# Patient Record
Sex: Male | Born: 1950 | Race: White | Hispanic: No | Marital: Married | Smoking: Current every day smoker
Health system: Southern US, Community
[De-identification: ages and names within clinical notes are randomized; demographics above are authoritative.]

## PROBLEM LIST (undated history)

## (undated) DIAGNOSIS — E785 Hyperlipidemia, unspecified: Secondary | ICD-10-CM

## (undated) DIAGNOSIS — M1990 Unspecified osteoarthritis, unspecified site: Secondary | ICD-10-CM

## (undated) DIAGNOSIS — I10 Essential (primary) hypertension: Secondary | ICD-10-CM

## (undated) DIAGNOSIS — K219 Gastro-esophageal reflux disease without esophagitis: Secondary | ICD-10-CM

## (undated) DIAGNOSIS — Z972 Presence of dental prosthetic device (complete) (partial): Secondary | ICD-10-CM

## (undated) DIAGNOSIS — G4730 Sleep apnea, unspecified: Secondary | ICD-10-CM

## (undated) DIAGNOSIS — Z87442 Personal history of urinary calculi: Secondary | ICD-10-CM

## (undated) DIAGNOSIS — R42 Dizziness and giddiness: Secondary | ICD-10-CM

## (undated) DIAGNOSIS — M109 Gout, unspecified: Secondary | ICD-10-CM

## (undated) DIAGNOSIS — R011 Cardiac murmur, unspecified: Secondary | ICD-10-CM

## (undated) DIAGNOSIS — G510 Bell's palsy: Secondary | ICD-10-CM

## (undated) DIAGNOSIS — J449 Chronic obstructive pulmonary disease, unspecified: Secondary | ICD-10-CM

## (undated) DIAGNOSIS — I480 Paroxysmal atrial fibrillation: Secondary | ICD-10-CM

## (undated) HISTORY — PX: COLONOSCOPY WITH PROPOFOL: SHX5780

## (undated) HISTORY — DX: Gout, unspecified: M10.9

## (undated) HISTORY — PX: LASIK: SHX215

## (undated) HISTORY — DX: Unspecified osteoarthritis, unspecified site: M19.90

## (undated) HISTORY — PX: KIDNEY STONE SURGERY: SHX686

## (undated) HISTORY — DX: Essential (primary) hypertension: I10

## (undated) HISTORY — DX: Cardiac murmur, unspecified: R01.1

## (undated) HISTORY — DX: Hyperlipidemia, unspecified: E78.5

## (undated) HISTORY — DX: Gastro-esophageal reflux disease without esophagitis: K21.9

## (undated) HISTORY — DX: Sleep apnea, unspecified: G47.30

## (undated) HISTORY — PX: SHOULDER ARTHROSCOPY: SHX128

---

## 2006-12-25 ENCOUNTER — Emergency Department: Payer: Self-pay | Admitting: Emergency Medicine

## 2008-02-21 ENCOUNTER — Ambulatory Visit: Payer: Self-pay | Admitting: Unknown Physician Specialty

## 2014-05-30 ENCOUNTER — Ambulatory Visit: Payer: Self-pay | Admitting: Neurology

## 2014-06-10 DIAGNOSIS — I480 Paroxysmal atrial fibrillation: Secondary | ICD-10-CM | POA: Insufficient documentation

## 2014-06-10 DIAGNOSIS — G4733 Obstructive sleep apnea (adult) (pediatric): Secondary | ICD-10-CM | POA: Insufficient documentation

## 2014-07-08 ENCOUNTER — Ambulatory Visit: Admit: 2014-07-08 | Disposition: A | Discharge status: Home / Self Care | Payer: Self-pay | Admitting: Neurology

## 2015-04-13 DIAGNOSIS — I10 Essential (primary) hypertension: Secondary | ICD-10-CM | POA: Insufficient documentation

## 2015-09-14 ENCOUNTER — Ambulatory Visit
Admission: RE | Admit: 2015-09-14 | Discharge: 2015-09-14 | Disposition: A | Discharge status: Home / Self Care | Payer: Medicare Other | Source: Ambulatory Visit | Attending: Neurology | Admitting: Neurology

## 2015-09-14 ENCOUNTER — Other Ambulatory Visit: Payer: Self-pay | Admitting: Neurology

## 2015-09-14 DIAGNOSIS — R29810 Facial weakness: Secondary | ICD-10-CM

## 2015-09-14 DIAGNOSIS — R930 Abnormal findings on diagnostic imaging of skull and head, not elsewhere classified: Secondary | ICD-10-CM | POA: Insufficient documentation

## 2015-09-14 DIAGNOSIS — R2990 Unspecified symptoms and signs involving the nervous system: Secondary | ICD-10-CM

## 2015-09-14 LAB — POCT I-STAT CREATININE: CREATININE: 1.7 mg/dL — AB (ref 0.61–1.24)

## 2015-09-14 MED ORDER — GADOBENATE DIMEGLUMINE 529 MG/ML IV SOLN
20.0000 mL | Freq: Once | INTRAVENOUS | Status: AC | PRN
  Administered 2015-09-14: 10 mL via INTRAVENOUS

## 2016-11-11 ENCOUNTER — Emergency Department: Payer: Medicare Other

## 2016-11-11 ENCOUNTER — Encounter: Payer: Self-pay | Admitting: Emergency Medicine

## 2016-11-11 ENCOUNTER — Inpatient Hospital Stay
Admission: EM | Admit: 2016-11-11 | Discharge: 2016-11-14 | DRG: 190 | Disposition: A | Discharge status: Home / Self Care | Payer: Medicare Other | Attending: Internal Medicine | Admitting: Internal Medicine

## 2016-11-11 DIAGNOSIS — E1122 Type 2 diabetes mellitus with diabetic chronic kidney disease: Secondary | ICD-10-CM | POA: Diagnosis present

## 2016-11-11 DIAGNOSIS — I480 Paroxysmal atrial fibrillation: Secondary | ICD-10-CM | POA: Diagnosis present

## 2016-11-11 DIAGNOSIS — J441 Chronic obstructive pulmonary disease with (acute) exacerbation: Secondary | ICD-10-CM

## 2016-11-11 DIAGNOSIS — G4733 Obstructive sleep apnea (adult) (pediatric): Secondary | ICD-10-CM | POA: Diagnosis present

## 2016-11-11 DIAGNOSIS — I959 Hypotension, unspecified: Secondary | ICD-10-CM | POA: Diagnosis present

## 2016-11-11 DIAGNOSIS — J9601 Acute respiratory failure with hypoxia: Secondary | ICD-10-CM | POA: Diagnosis present

## 2016-11-11 DIAGNOSIS — I129 Hypertensive chronic kidney disease with stage 1 through stage 4 chronic kidney disease, or unspecified chronic kidney disease: Secondary | ICD-10-CM | POA: Diagnosis present

## 2016-11-11 DIAGNOSIS — I248 Other forms of acute ischemic heart disease: Secondary | ICD-10-CM | POA: Diagnosis present

## 2016-11-11 DIAGNOSIS — E785 Hyperlipidemia, unspecified: Secondary | ICD-10-CM | POA: Diagnosis present

## 2016-11-11 DIAGNOSIS — E876 Hypokalemia: Secondary | ICD-10-CM | POA: Diagnosis not present

## 2016-11-11 DIAGNOSIS — R0600 Dyspnea, unspecified: Secondary | ICD-10-CM

## 2016-11-11 DIAGNOSIS — F17210 Nicotine dependence, cigarettes, uncomplicated: Secondary | ICD-10-CM | POA: Diagnosis present

## 2016-11-11 DIAGNOSIS — N183 Chronic kidney disease, stage 3 (moderate): Secondary | ICD-10-CM | POA: Diagnosis present

## 2016-11-11 DIAGNOSIS — Z7982 Long term (current) use of aspirin: Secondary | ICD-10-CM | POA: Diagnosis not present

## 2016-11-11 DIAGNOSIS — E872 Acidosis: Secondary | ICD-10-CM | POA: Diagnosis present

## 2016-11-11 DIAGNOSIS — E860 Dehydration: Secondary | ICD-10-CM | POA: Diagnosis present

## 2016-11-11 DIAGNOSIS — J9600 Acute respiratory failure, unspecified whether with hypoxia or hypercapnia: Secondary | ICD-10-CM | POA: Diagnosis present

## 2016-11-11 DIAGNOSIS — N179 Acute kidney failure, unspecified: Secondary | ICD-10-CM | POA: Diagnosis present

## 2016-11-11 DIAGNOSIS — J189 Pneumonia, unspecified organism: Secondary | ICD-10-CM

## 2016-11-11 DIAGNOSIS — Z7901 Long term (current) use of anticoagulants: Secondary | ICD-10-CM | POA: Diagnosis not present

## 2016-11-11 DIAGNOSIS — Z87442 Personal history of urinary calculi: Secondary | ICD-10-CM | POA: Diagnosis not present

## 2016-11-11 HISTORY — DX: Chronic obstructive pulmonary disease, unspecified: J44.9

## 2016-11-11 LAB — BASIC METABOLIC PANEL
Anion gap: 12 (ref 5–15)
BUN: 33 mg/dL — AB (ref 6–20)
CHLORIDE: 99 mmol/L — AB (ref 101–111)
CO2: 26 mmol/L (ref 22–32)
CREATININE: 2.64 mg/dL — AB (ref 0.61–1.24)
Calcium: 9.6 mg/dL (ref 8.9–10.3)
GFR calc Af Amer: 27 mL/min — ABNORMAL LOW (ref >60)
GFR calc non Af Amer: 24 mL/min — ABNORMAL LOW (ref >60)
GLUCOSE: 154 mg/dL — AB (ref 65–99)
POTASSIUM: 3.5 mmol/L (ref 3.5–5.1)
Sodium: 137 mmol/L (ref 135–145)

## 2016-11-11 LAB — CBC
HEMATOCRIT: 50.2 % (ref 40.0–52.0)
Hemoglobin: 16.9 g/dL (ref 13.0–18.0)
MCH: 31.5 pg (ref 26.0–34.0)
MCHC: 33.6 g/dL (ref 32.0–36.0)
MCV: 93.7 fL (ref 80.0–100.0)
Platelets: 254 10*3/uL (ref 150–440)
RBC: 5.36 MIL/uL (ref 4.40–5.90)
RDW: 14.5 % (ref 11.5–14.5)
WBC: 30.4 10*3/uL — AB (ref 3.8–10.6)

## 2016-11-11 LAB — GLUCOSE, CAPILLARY
Glucose-Capillary: 202 mg/dL — ABNORMAL HIGH (ref 65–99)
Glucose-Capillary: 209 mg/dL — ABNORMAL HIGH (ref 65–99)

## 2016-11-11 LAB — LACTIC ACID, PLASMA
LACTIC ACID, VENOUS: 1.9 mmol/L (ref 0.5–1.9)
Lactic Acid, Venous: 2.1 mmol/L (ref 0.5–1.9)

## 2016-11-11 LAB — TROPONIN I
TROPONIN I: 0.12 ng/mL — AB (ref <0.03)
Troponin I: 0.04 ng/mL (ref <0.03)

## 2016-11-11 LAB — MRSA PCR SCREENING: MRSA BY PCR: NEGATIVE

## 2016-11-11 MED ORDER — ACETAMINOPHEN 325 MG PO TABS
650.0000 mg | ORAL_TABLET | Freq: Four times a day (QID) | ORAL | Status: DC | PRN
Start: 2016-11-11 — End: 2016-11-14

## 2016-11-11 MED ORDER — SODIUM CHLORIDE 0.9 % IV SOLN: INTRAVENOUS | Status: DC

## 2016-11-11 MED ORDER — METHYLPREDNISOLONE SODIUM SUCC 125 MG IJ SOLR: 60.0000 mg | INTRAMUSCULAR | Status: DC

## 2016-11-11 MED ORDER — SODIUM CHLORIDE 0.9 % IV BOLUS (SEPSIS)
1000.0000 mL | Freq: Once | INTRAVENOUS | Status: AC
Start: 2016-11-11 — End: 2016-11-11
  Administered 2016-11-11: 1000 mL via INTRAVENOUS

## 2016-11-11 MED ORDER — ALBUTEROL SULFATE (2.5 MG/3ML) 0.083% IN NEBU
5.0000 mg | INHALATION_SOLUTION | Freq: Once | RESPIRATORY_TRACT | Status: AC
  Administered 2016-11-11: 5 mg via RESPIRATORY_TRACT
  Filled 2016-11-11: qty 6

## 2016-11-11 MED ORDER — ASPIRIN EC 81 MG PO TBEC
324.0000 mg | DELAYED_RELEASE_TABLET | Freq: Every day | ORAL | Status: DC
  Administered 2016-11-12 – 2016-11-13 (×2): 324 mg via ORAL
  Filled 2016-11-11 (×4): qty 4

## 2016-11-11 MED ORDER — BUDESONIDE 0.25 MG/2ML IN SUSP
0.2500 mg | Freq: Two times a day (BID) | RESPIRATORY_TRACT | Status: DC
  Administered 2016-11-11 – 2016-11-14 (×6): 0.25 mg via RESPIRATORY_TRACT
  Filled 2016-11-11 (×6): qty 2

## 2016-11-11 MED ORDER — CHLORTHALIDONE 25 MG PO TABS
25.0000 mg | ORAL_TABLET | Freq: Every day | ORAL | Status: DC
  Administered 2016-11-13 – 2016-11-14 (×2): 25 mg via ORAL
  Filled 2016-11-11 (×3): qty 1

## 2016-11-11 MED ORDER — IPRATROPIUM-ALBUTEROL 0.5-2.5 (3) MG/3ML IN SOLN
3.0000 mL | Freq: Once | RESPIRATORY_TRACT | Status: AC
  Administered 2016-11-11: 3 mL via RESPIRATORY_TRACT
  Filled 2016-11-11: qty 3

## 2016-11-11 MED ORDER — MAGNESIUM SULFATE 2 GM/50ML IV SOLN
2.0000 g | Freq: Once | INTRAVENOUS | Status: AC
  Administered 2016-11-11: 2 g via INTRAVENOUS
  Filled 2016-11-11: qty 50

## 2016-11-11 MED ORDER — IPRATROPIUM-ALBUTEROL 0.5-2.5 (3) MG/3ML IN SOLN
3.0000 mL | RESPIRATORY_TRACT | Status: DC
  Administered 2016-11-11 – 2016-11-13 (×8): 3 mL via RESPIRATORY_TRACT
  Filled 2016-11-11 (×10): qty 3

## 2016-11-11 MED ORDER — ATORVASTATIN CALCIUM 20 MG PO TABS
40.0000 mg | ORAL_TABLET | Freq: Every day | ORAL | Status: DC
  Administered 2016-11-12 – 2016-11-14 (×3): 40 mg via ORAL
  Filled 2016-11-11 (×3): qty 2

## 2016-11-11 MED ORDER — IPRATROPIUM-ALBUTEROL 0.5-2.5 (3) MG/3ML IN SOLN: 3.0000 mL | Freq: Four times a day (QID) | RESPIRATORY_TRACT | Status: DC

## 2016-11-11 MED ORDER — METHYLPREDNISOLONE SODIUM SUCC 40 MG IJ SOLR
40.0000 mg | Freq: Two times a day (BID) | INTRAMUSCULAR | Status: DC
  Administered 2016-11-12 – 2016-11-14 (×5): 40 mg via INTRAVENOUS
  Filled 2016-11-11 (×5): qty 1

## 2016-11-11 MED ORDER — DEXTROSE 5 % IV SOLN
500.0000 mg | INTRAVENOUS | Status: DC
  Administered 2016-11-12 – 2016-11-13 (×2): 500 mg via INTRAVENOUS
  Filled 2016-11-11 (×3): qty 500

## 2016-11-11 MED ORDER — DEXTROSE 5 % IV SOLN
1.0000 g | INTRAVENOUS | Status: DC
  Filled 2016-11-11: qty 10

## 2016-11-11 MED ORDER — SODIUM CHLORIDE 0.9 % IV SOLN
INTRAVENOUS | Status: DC
  Administered 2016-11-11: 21:00:00 via INTRAVENOUS
  Administered 2016-11-12: 100 mL/h via INTRAVENOUS
  Administered 2016-11-12: 11:00:00 via INTRAVENOUS
  Administered 2016-11-12: 100 mL via INTRAVENOUS
  Administered 2016-11-14: 05:00:00 via INTRAVENOUS

## 2016-11-11 MED ORDER — ALLOPURINOL 300 MG PO TABS
300.0000 mg | ORAL_TABLET | Freq: Every day | ORAL | Status: DC
  Administered 2016-11-12 – 2016-11-14 (×3): 300 mg via ORAL
  Filled 2016-11-11 (×3): qty 1

## 2016-11-11 MED ORDER — METHYLPREDNISOLONE SODIUM SUCC 125 MG IJ SOLR
125.0000 mg | Freq: Once | INTRAMUSCULAR | Status: AC
  Administered 2016-11-11: 125 mg via INTRAVENOUS
  Filled 2016-11-11: qty 2

## 2016-11-11 MED ORDER — VERAPAMIL HCL ER 180 MG PO TBCR
360.0000 mg | EXTENDED_RELEASE_TABLET | Freq: Every day | ORAL | Status: DC
  Administered 2016-11-12 – 2016-11-14 (×3): 360 mg via ORAL
  Filled 2016-11-11 (×4): qty 2

## 2016-11-11 MED ORDER — ACETAMINOPHEN 650 MG RE SUPP: 650.0000 mg | Freq: Four times a day (QID) | RECTAL | Status: DC | PRN

## 2016-11-11 MED ORDER — DEXTROSE 5 % IV SOLN
1.0000 g | Freq: Once | INTRAVENOUS | Status: AC
  Administered 2016-11-11: 1 g via INTRAVENOUS
  Filled 2016-11-11: qty 10

## 2016-11-11 MED ORDER — DEXTROSE 5 % IV SOLN
500.0000 mg | Freq: Once | INTRAVENOUS | Status: AC
  Administered 2016-11-11: 500 mg via INTRAVENOUS
  Filled 2016-11-11: qty 500

## 2016-11-11 MED ORDER — SODIUM CHLORIDE 0.9 % IV BOLUS (SEPSIS)
1000.0000 mL | Freq: Once | INTRAVENOUS | Status: AC
  Administered 2016-11-11: 1000 mL via INTRAVENOUS

## 2016-11-11 MED ORDER — ONDANSETRON HCL 4 MG PO TABS: 4.0000 mg | ORAL_TABLET | Freq: Four times a day (QID) | ORAL | Status: DC | PRN

## 2016-11-11 MED ORDER — BISACODYL 5 MG PO TBEC: 5.0000 mg | DELAYED_RELEASE_TABLET | Freq: Every day | ORAL | Status: DC | PRN

## 2016-11-11 MED ORDER — IPRATROPIUM-ALBUTEROL 0.5-2.5 (3) MG/3ML IN SOLN: 3.0000 mL | Freq: Four times a day (QID) | RESPIRATORY_TRACT | Status: DC | PRN

## 2016-11-11 MED ORDER — ONDANSETRON HCL 4 MG/2ML IJ SOLN: 4.0000 mg | Freq: Four times a day (QID) | INTRAMUSCULAR | Status: DC | PRN

## 2016-11-11 MED ORDER — APIXABAN 5 MG PO TABS
5.0000 mg | ORAL_TABLET | Freq: Two times a day (BID) | ORAL | Status: DC
  Administered 2016-11-11 – 2016-11-14 (×6): 5 mg via ORAL
  Filled 2016-11-11 (×6): qty 1

## 2016-11-11 MED ORDER — INSULIN ASPART 100 UNIT/ML ~~LOC~~ SOLN
0.0000 [IU] | SUBCUTANEOUS | Status: DC
  Administered 2016-11-11: 2 [IU] via SUBCUTANEOUS
  Administered 2016-11-12: 1 [IU] via SUBCUTANEOUS
  Administered 2016-11-12: 2 [IU] via SUBCUTANEOUS
  Administered 2016-11-12: 3 [IU] via SUBCUTANEOUS
  Administered 2016-11-12: 5 [IU] via SUBCUTANEOUS
  Administered 2016-11-12: 100 [IU] via SUBCUTANEOUS
  Administered 2016-11-13: 1 [IU] via SUBCUTANEOUS
  Administered 2016-11-13 (×2): 2 [IU] via SUBCUTANEOUS
  Administered 2016-11-13: 1 [IU] via SUBCUTANEOUS
  Administered 2016-11-13: 2 [IU] via SUBCUTANEOUS
  Filled 2016-11-11 (×11): qty 1

## 2016-11-11 MED ORDER — DOCUSATE SODIUM 100 MG PO CAPS
100.0000 mg | ORAL_CAPSULE | Freq: Two times a day (BID) | ORAL | Status: DC
  Administered 2016-11-11 – 2016-11-14 (×6): 100 mg via ORAL
  Filled 2016-11-11 (×6): qty 1

## 2016-11-11 NOTE — ED Notes (Signed)
ED Provider at bedside. 

## 2016-11-11 NOTE — ED Provider Notes (Signed)
[facility] Emergency Department Provider Note    First MD Initiated Contact with Patient 11/11/16 1627     (approximate)  I have reviewed the triage vital signs and the nursing notes.   HISTORY  Chief Complaint Shortness of Breath    HPI Christian Cline is a 66 y.o. male with a history of COPD not on any home oxygen presents with 3 days of worsening shortness of breath, fatigue, fever and chills with a productive cough with dark phlegm. Denies any chest pain. States that symptoms have become progressively worse. Went to an urgent care this morning due to worsening generalized malaise and was directed to the ER. Has not been on any recent antibiotics. No abdominal pain. No lower extremity swelling. No history of congestive heart failure.   Past Medical History:  Diagnosis Date  . COPD (chronic obstructive pulmonary disease) ([place])    No family history on file. No past surgical history on file. Patient Active Problem List   Diagnosis Date Noted  . Acute respiratory failure ([place]) 11/11/2016      Prior to Admission medications   Medication Sig Start Date End Date Taking? Authorizing Provider  allopurinol (ZYLOPRIM) 300 MG tablet Take 1 tablet by mouth daily. 09/30/16  Yes [provider]  aspirin EC 81 MG tablet Take 4 tablets by mouth daily. 09/16/15  Yes [provider]  atorvastatin (LIPITOR) 40 MG tablet Take 1 tablet by mouth daily.   Yes [provider]  chlorthalidone (HYGROTON) 25 MG tablet Take 1 tablet by mouth daily. 08/19/16  Yes [provider]  docusate sodium (COLACE) 100 MG capsule Take 100 mg by mouth daily as needed for mild constipation.   Yes [provider]  ELIQUIS 5 MG TABS tablet Take 1 tablet by mouth 2 (two) times daily. 09/04/16  Yes [provider]  meclizine (ANTIVERT) 25 MG tablet Take 1 tablet by mouth 3 (three) times daily as needed.   Yes [provider]    quinapril (ACCUPRIL) 40 MG tablet Take 1 tablet by mouth daily. 09/30/16  Yes [provider]  ranitidine (ZANTAC) 150 MG tablet Take 150 mg by mouth 2 (two) times daily.   Yes [provider]  verapamil (VERELAN PM) 360 MG 24 hr capsule Take 1 capsule by mouth daily. 08/10/16  Yes [provider]  Ipratropium-Albuterol (COMBIVENT RESPIMAT) 20-100 MCG/ACT AERS respimat Inhale 1 puff into the lungs every 6 (six) hours.    [provider]    Allergies Patient has no known allergies.    Social History Social History  Substance Use Topics  . Smoking status: Not on file  . Smokeless tobacco: Not on file  . Alcohol use Not on file    Review of Systems Patient denies headaches, rhinorrhea, blurry vision, numbness, shortness of breath, chest pain, edema, cough, abdominal pain, nausea, vomiting, diarrhea, dysuria, fevers, rashes or hallucinations unless otherwise stated above in HPI. ____________________________________________   PHYSICAL EXAM:  VITAL SIGNS: Vitals:   11/11/16 1730 11/11/16 1856  BP: (!) 80/56   Pulse: (!) 124 (!) 112  Resp: (!) 25 (!) 30  Temp:  98.4 F (36.9 C)  SpO2: 97% 97%    Constitutional: Alert and oriented. ill appearing and in moderate resp distress. Eyes: Conjunctivae are normal.  Head: Atraumatic. Nose: No congestion/rhinnorhea. Mouth/Throat: Mucous membranes are moist.   Neck: No stridor. Painless ROM.  Cardiovascular: tachycardic rate, regular rhythm. Grossly normal heart sounds.  Good peripheral circulation. Respiratory:  tachypnic speaking in short phrases, diminshed breathsounds throughout, + use of accessory muscles Gastrointestinal: Soft and nontender. No distention. No abdominal bruits. No CVA tenderness. Genitourinary:  Musculoskeletal: No lower extremity tenderness nor edema.  No joint effusions. Neurologic:  Normal speech and language. No gross focal neurologic deficits are appreciated. No facial  droop Skin:  Skin is warm, dry and intact. No rash noted. Psychiatric: Mood and affect are normal. Speech and behavior are normal.  ____________________________________________   LABS (all labs ordered are listed, but only abnormal results are displayed)  Results for orders placed or performed during the hospital encounter of 11/11/16 (from the past 24 hour(s))  Basic metabolic panel     Status: Abnormal   Collection Time: 11/11/16  1:24 PM  Result Value Ref Range   Sodium 137 135 - 145 mmol/L   Potassium 3.5 3.5 - 5.1 mmol/L   Chloride 99 (L) 101 - 111 mmol/L   CO2 26 22 - 32 mmol/L   Glucose, Bld 154 (H) 65 - 99 mg/dL   BUN 33 (H) 6 - 20 mg/dL   Creatinine, Ser 2.64 (H) 0.61 - 1.24 mg/dL   Calcium 9.6 8.9 - 10.3 mg/dL   GFR calc non Af Amer 24 (L) >60 mL/min   GFR calc Af Amer 27 (L) >60 mL/min   Anion gap 12 5 - 15  CBC     Status: Abnormal   Collection Time: 11/11/16  1:24 PM  Result Value Ref Range   WBC 30.4 (H) 3.8 - 10.6 K/uL   RBC 5.36 4.40 - 5.90 MIL/uL   Hemoglobin 16.9 13.0 - 18.0 g/dL   HCT 50.2 40.0 - 52.0 %   MCV 93.7 80.0 - 100.0 fL   MCH 31.5 26.0 - 34.0 pg   MCHC 33.6 32.0 - 36.0 g/dL   RDW 14.5 11.5 - 14.5 %   Platelets 254 150 - 440 K/uL  Troponin I     Status: Abnormal   Collection Time: 11/11/16  1:24 PM  Result Value Ref Range   Troponin I 0.12 (HH) <0.03 ng/mL  Lactic acid, plasma     Status: Abnormal   Collection Time: 11/11/16  6:00 PM  Result Value Ref Range   Lactic Acid, Venous 2.1 (HH) 0.5 - 1.9 mmol/L  Glucose, capillary     Status: Abnormal   Collection Time: 11/11/16  6:53 PM  Result Value Ref Range   Glucose-Capillary 202 (H) 65 - 99 mg/dL   ____________________________________________  EKG My review and personal interpretation at Time: 13:14   Indication: sob  Rate: 110  Rhythm: sinus Axis: left Other: rbbb, prolonged qt, non specific st changes, no stemi ____________________________________________  RADIOLOGY  I personally  reviewed all radiographic images ordered to evaluate for the above acute complaints and reviewed radiology reports and findings.  These findings were personally discussed with the patient.  Please see medical record for radiology report. ____________________________________________   PROCEDURES  Procedure(s) performed:  Procedures    Critical Care performed: yes CRITICAL CARE Performed by: Merlyn Lot   Total critical care time: 45 minutes  Critical care time was exclusive of separately billable procedures and treating other patients.  Critical care was necessary to treat or prevent imminent or life-threatening deterioration.  Critical care was time spent personally by me on the following activities: development of treatment plan with patient and/or surrogate as well as nursing, discussions with consultants, evaluation of patient's response to treatment, examination of patient, obtaining history from patient or surrogate, ordering  and performing treatments and interventions, ordering and review of laboratory studies, ordering and review of radiographic studies, pulse oximetry and re-evaluation of patient's condition.  ____________________________________________   INITIAL IMPRESSION / ASSESSMENT AND PLAN / ED COURSE  Pertinent labs & imaging results that were available during my care of the patient were reviewed by me and considered in my medical decision making (see chart for details).  DDX: Asthma, copd, CHF, pna, ptx, malignancy, Pe, anemia   ADOLPHO MEENACH is a 66 y.o. who presents to the ED with 3 days worsening shortness of breath cough fever and chills as described above. Patient does arrive in moderate respiratory distress with diminished breath sounds throughout. Patient given multiple nebulizer treatments and has some improvement but based on his persistent tachypnea and tachycardia the patient was placed on BiPAP for further assistance with his respiratory  effort. He does have evidence of a KI as well as a markedly leukocytosis. Given his work of breathing and symptoms I will treat him for community-acquired pneumonia. He was given IV steroids and IV magnesium. Seems less consistent with ACS as he does not have any chest pain or pressure at this time and symptoms seem to be better explained by COPD exacerbation. I do believe the patient will require admission to the hospital however.  Have discussed with the patient and available family all diagnostics and treatments performed thus far and all questions were answered to the best of my ability. The patient demonstrates understanding and agreement with plan.       ____________________________________________   FINAL CLINICAL IMPRESSION(S) / ED DIAGNOSES  Final diagnoses:  COPD exacerbation ([place])  AKI (acute kidney injury) ([place])  Community acquired pneumonia, unspecified laterality      NEW MEDICATIONS STARTED DURING THIS VISIT:  Current Discharge Medication List       Note:  This document was prepared using Dragon voice recognition software and may include unintentional dictation errors.    Merlyn Lot, MD 11/11/16 [phone]

## 2016-11-11 NOTE — H&P (Signed)
[facility] [place] NAME: Christian Cline    MR#:  [id number]  DATE OF BIRTH:  [birth year]  DATE OF ADMISSION:  11/11/2016  PRIMARY CARE PHYSICIAN: Renee Rival, NP   REQUESTING/REFERRING PHYSICIAN: Dr. Merlyn Lot  CHIEF COMPLAINT: Shortness of breath    Chief Complaint  Patient presents with  . Shortness of Breath    HISTORY OF PRESENT ILLNESS:  Christian Cline  is a 66 y.o. male with a known history ofParoxysmal atrial fibrillation, essential hypertension, history of obstructive sleep apnea comes in because of shortness of breath getting progressively worse since Wednesday. Patient also had low-grade temperature associated with chills, cough and white phlegm, wheezing also since Wednesday. Patient chest x-ray showed bronchitis. Patient received series of nebulizers, Solu-Medrol, antibiotics but he still feels the same and has respiratory rate up to 35, O2 saturation 99% on 3 LLiters, tachycardic with heart rate up to 1 22 bpm so we are starting him on BiPAP.  Pt  is a heavy smoker smokes about 1-1/2 packs of cigarettes for 45 years.  PAST MEDICAL HISTORY:   Past Medical History:  Diagnosis Date  . COPD (chronic obstructive pulmonary disease) ([facility])     PAST SURGICAL HISTOIRY:  No past surgical history on file.  SOCIAL HISTORY:   Social History  Substance Use Topics  . Smoking status: Not on file  . Smokeless tobacco: Not on file  . Alcohol use Not on file    FAMILY HISTORY:  No family history on file.  DRUG ALLERGIES:  No Known Allergies  REVIEW OF SYSTEMS:  CONSTITUTIONAL: Low-grade temperature at home.  EYES: No blurred or double vision.  EARS, NOSE, AND THROAT: No tinnitus or ear pain.  RESPIRATORY: Cough, shortness of breath, wheezing for 3 days CARDIOVASCULAR: No chest pain, orthopnea, edema.  GASTROINTESTINAL: No nausea, vomiting, diarrhea or abdominal pain.  GENITOURINARY: No dysuria,  hematuria.  ENDOCRINE: No polyuria, nocturia,  HEMATOLOGY: No anemia, easy bruising or bleeding SKIN: No rash or lesion. MUSCULOSKELETAL: No joint pain or arthritis.   NEUROLOGIC: No tingling, numbness, weakness.  PSYCHIATRY: No anxiety or depression.   MEDICATIONS AT HOME:   Prior to Admission medications   Medication Sig Start Date End Date Taking? Authorizing Provider  aspirin EC 81 MG tablet Take 4 tablets by mouth daily. 09/16/15  Yes [provider]  allopurinol (ZYLOPRIM) 300 MG tablet Take 1 tablet by mouth daily. 09/30/16   [provider]  atorvastatin (LIPITOR) 40 MG tablet Take 1 tablet by mouth daily.    [provider]  chlorthalidone (HYGROTON) 25 MG tablet Take 1 tablet by mouth daily. 08/19/16   [provider]  ELIQUIS 5 MG TABS tablet Take 1 tablet by mouth 2 (two) times daily. 09/04/16   [provider]  meclizine (ANTIVERT) 25 MG tablet Take 1 tablet by mouth 3 (three) times daily as needed.    [provider]  quinapril (ACCUPRIL) 40 MG tablet Take 1 tablet by mouth daily. 09/30/16   [provider]  verapamil (VERELAN PM) 360 MG 24 hr capsule Take 1 capsule by mouth daily. 08/10/16   [provider]      VITAL SIGNS:  Blood pressure 98/64, pulse (!) 126, temperature 98 F (36.7 C), temperature source Oral, resp. rate (!) 32, height 5\' 10"  (1.778 m), weight 104.3 kg (230 lb), SpO2 94 %.  PHYSICAL EXAMINATION:  GENERAL:  66 y.o.-year-old patient lying in the bed with no acute  distress.  EYES: Pupils equal, round, reactive to light . No scleral icterus. Extraocular muscles intact.  HEENT: Head atraumatic, normocephalic. Oropharynx and nasopharynx clear.  NECK:  Supple, no jugular venous distention. No thyroid enlargement, no tenderness.  LUNGS: Decreased breath sounds bilaterally, patient also has expiratory wheezes in both lung fields.  CARDIOVASCULAR: S1, S2 normal. No murmurs, rubs, or gallops.   ABDOMEN: Soft, nontender, nondistended. Bowel sounds present. No organomegaly or mass.  EXTREMITIES: No pedal edema, cyanosis, or clubbing.  NEUROLOGIC: Cranial nerves II through XII are intact. Muscle strength 5/5 in all extremities. Sensation intact. Gait not checked.  PSYCHIATRIC: The patient is alert and oriented x 3.  SKIN: No obvious rash, lesion, or ulcer.   LABORATORY PANEL:   CBC  Recent Labs Lab 11/11/16 1324  WBC 30.4*  HGB 16.9  HCT 50.2  PLT 254   ------------------------------------------------------------------------------------------------------------------  Chemistries   Recent Labs Lab 11/11/16 1324  NA 137  K 3.5  CL 99*  CO2 26  GLUCOSE 154*  BUN 33*  CREATININE 2.64*  CALCIUM 9.6   ------------------------------------------------------------------------------------------------------------------  Cardiac Enzymes No results for input(s): TROPONINI in the last 168 hours. ------------------------------------------------------------------------------------------------------------------  RADIOLOGY:  Dg Chest 2 View  Result Date: 11/11/2016 CLINICAL DATA:  Shortness of breath. EXAM: CHEST  2 VIEW COMPARISON:  None. FINDINGS: The cardiomediastinal silhouette is normal in size. Normal pulmonary vascularity. Coarsened interstitial markings with diffuse peribronchial thickening. No focal consolidation, pleural effusion, or pneumothorax. No acute osseous abnormality. IMPRESSION: Bronchitic changes.  No consolidation. Electronically Signed   By: Titus Dubin M.D.   On: 11/11/2016 14:03    EKG:   Orders placed or performed during the hospital encounter of 11/11/16  . ED EKG  . ED EKG  Sinus tachycardia with 1 22 bpm  IMPRESSION AND PLAN:    1/ acute respiratory failure with hypoxia due to COPD exacerbation'admit to intensive care unit, continue IV steroids, bronchodilators, started on BiPAP because of respiratory distress and tachypnea. White count  is 13.4. Check lactic acid, start empiric antibiotics*  #2 proximal atrial fibrillation: Patient follows with Dr. Ubaldo Glassing continue full dose anticoagulation with Eiquis,, verapamil 360 mg by mouth daily,  #3 .heavy tobacco abuse: Counselled against smoking for 10 min.start nicotine patch. CALLED E link  And spoke to Dr.Nestor #4. Acute on chronic renal failure, chronic kidney disease stage III: Baseline creatinine around 1.7;. Start gentle hydration. Patient has history of renal stones, according to him patient has embedded stones in the left kidney and they are not operable.   All the records are reviewed and case discussed with ED provider. Management plans discussed with the patient, family and they are in agreement.  CODE STATUS: full  TOTAL TIME TAKING CARE OF THIS PATIENT:55 minutes. (CCT)   Houda Brau M.D on 11/11/2016 at 5:35 PM  Between 7am to 6pm - Pager - [phone]  After 6pm go to [email or web address] - password [email or web address] [facility]  [facility]  Office  [phone]  CC: Primary care physician; Renee Rival, NP  Note: This dictation was prepared with Dragon dictation along with smaller phrase technology. Any transcriptional errors that result from this process are unintentional.

## 2016-11-11 NOTE — ED Notes (Signed)
Dr Vianne Bulls notified by phone of trop 0.12. She will put in orders

## 2016-11-11 NOTE — Progress Notes (Signed)
Pharmacy Antibiotic Note  Christian Cline is a 66 y.o. male admitted on 11/11/2016 with COPD exacerbation/empiric abx.  Pharmacy has been consulted for ceftriaxone/azithromycin dosing. Pt received azithromycin and ceftriaxone in the ED.   Plan: Will order azithromycin 500 mg IV q24h and ceftriaxone 1 g IV q24h.   Height: 5' 9.5" (176.5 cm) Weight: 227 lb 8.2 oz (103.2 kg) IBW/kg (Calculated) : 71.85  Temp (24hrs), Avg:98.2 F (36.8 C), Min:98 F (36.7 C), Max:98.4 F (36.9 C)   Recent Labs Lab 11/11/16 1324 11/11/16 1800  WBC 30.4*  --   CREATININE 2.64*  --   LATICACIDVEN  --  2.1*    Estimated Creatinine Clearance: 32.9 mL/min (A) (by C-G formula based on SCr of 2.64 mg/dL (H)).    No Known Allergies  Antimicrobials this admission: Ceftriaxone/azithromycin 8/31 >>  Dose adjustments this admission:   Microbiology results: 8/31 BCx: sent 8/31 MRSA PCR: sent  Thank you for allowing pharmacy to be a part of this patient's care.  Rocky Morel 11/11/2016 7:54 PM

## 2016-11-11 NOTE — Consult Note (Signed)
Name: Christian Cline MRN: [id number] DOB: [birth year]    ADMISSION DATE:  11/11/2016 CONSULTATION DATE: 11/11/2016  REFERRING MD : Dr. Vianne Bulls   CHIEF COMPLAINT: Shortness of Breath and Cough   BRIEF PATIENT DESCRIPTION:  66 yo male admitted 08/31 with acute on chronic respiratory failure secondary to AECOPD and possible CAP requiring continuous Bipap   SIGNIFICANT EVENTS  08/31-Pt admitted to the stepdown unit  STUDIES:  None   HISTORY OF PRESENT ILLNESS:   This is a 66 yo male with a PMH of COPD, HTN, Atrial Fibrillation on Eliquis, OSA-CPAP qhs, Hyperlipidemia, and Current Everyday Smoker.  He presented to [facility] ER 08/31 with c/o worsening shortness of breath and cough onset of symptoms 08/28. Upon arrival to the ER the pt was tachypneic with labored respirations O2 sats 95% on RA, therefore pt placed on continuous Bipap.  He was subsequently admitted to the [facility] Unit by hospitalist team for further workup and treatment PCCM consulted.    PAST MEDICAL HISTORY :   has a past medical history of COPD (chronic obstructive pulmonary disease) ([place]).  has no past surgical history on file. Prior to Admission medications   Medication Sig Start Date End Date Taking? Authorizing Provider  aspirin EC 81 MG tablet Take 4 tablets by mouth daily. 09/16/15  Yes [provider]  allopurinol (ZYLOPRIM) 300 MG tablet Take 1 tablet by mouth daily. 09/30/16   [provider]  atorvastatin (LIPITOR) 40 MG tablet Take 1 tablet by mouth daily.    [provider]  chlorthalidone (HYGROTON) 25 MG tablet Take 1 tablet by mouth daily. 08/19/16   [provider]  ELIQUIS 5 MG TABS tablet Take 1 tablet by mouth 2 (two) times daily. 09/04/16   [provider]  meclizine (ANTIVERT) 25 MG tablet Take 1 tablet by mouth 3 (three) times daily as needed.    [provider]  quinapril (ACCUPRIL) 40 MG tablet Take 1 tablet by mouth daily. 09/30/16   [provider]  verapamil (VERELAN PM) 360 MG 24 hr capsule Take 1 capsule by mouth daily. 08/10/16   [provider]   No Known Allergies  FAMILY HISTORY:  family history is not on file. SOCIAL HISTORY:    REVIEW OF SYSTEMS:  Unable to assess pt on continuous Bipap  SUBJECTIVE:  Pt on continuous Bipap  VITAL SIGNS: Temp:  [98 F (36.7 C)] 98 F (36.7 C) (08/31 1312) Pulse Rate:  [105-126] 126 (08/31 1718) Resp:  [26-32] 32 (08/31 1718) BP: (98-139)/(64-79) 98/64 (08/31 1718) SpO2:  [94 %-96 %] 94 % (08/31 1718) Weight:  [104.3 kg (230 lb)] 104.3 kg (230 lb) (08/31 1312)  PHYSICAL EXAMINATION: General: well developed, well nourished Caucasian male on Bipap Neuro: alert and oriented, follows commands  HEENT: supple, mild JVD Cardiovascular: sinus tach, s1s2, no M/R/G Lungs: rhonchi and expiratory wheezes throughout, even, labored  Abdomen: +BS x4, soft, obese, non tender, non distended  Musculoskeletal: normal bulk and tone, no edema  Skin: intact no rashes or lesions    Recent Labs Lab 11/11/16 1324  NA 137  K 3.5  CL 99*  CO2 26  BUN 33*  CREATININE 2.64*  GLUCOSE 154*    Recent Labs Lab 11/11/16 1324  HGB 16.9  HCT 50.2  WBC 30.4*  PLT 254   Dg Chest 2 View  Result Date: 11/11/2016 CLINICAL DATA:  Shortness of breath. EXAM: CHEST  2 VIEW COMPARISON:  None. FINDINGS: The cardiomediastinal silhouette is normal  in size. Normal pulmonary vascularity. Coarsened interstitial markings with diffuse peribronchial thickening. No focal consolidation, pleural effusion, or pneumothorax. No acute osseous abnormality. IMPRESSION: Bronchitic changes.  No consolidation. Electronically Signed   By: Titus Dubin M.D.   On: 11/11/2016 14:03    ASSESSMENT / PLAN: Acute on chronic respiratory failure secondary to AECOPD and possible CAP Leukocytosis  Lactic acidosis   Acute renal failure  Mildly elevated troponin's likely demand ischemia  Hx: OSA, Atrial  Fibrillation, HTN, and Current Everyday Smoker  P: Continuous Bipap for now wean as tolerated  Will need CPAP qhs once off Bipap Maintain O2 sats 88% to 92% Scheduled and prn bronchodilator therapy Nebulized and IV steroids  Prn CXR Trend WBC and monitor fever curve Trend PCT and lactic acid  Follow cultures  Continue abx for now  Continuous telemetry monitoring Trend troponin's Continue outpatient eliquis, aspirin, atorvastatin, chlorthalidone, and verapamil  Trend BMP Replace electrolytes as indicated Monitor UOP SSI   Marda Stalker, [place] Pager [phone] (please enter 7 digits) PCCM Consult Pager [phone] (please enter 7 digits)

## 2016-11-11 NOTE — ED Triage Notes (Addendum)
Pt reports increasing SOB and cough for three days. Pt reports history of COPD. Denies wearing O2 at home, states uses CPAP at night. Pt with noted labored respirations in triage. Room air SpO2 95%.

## 2016-11-11 NOTE — Progress Notes (Signed)
eLink Physician-Brief Progress Note Patient Name: Christian Cline DOB: 1950-06-14 MRN: [id number]   Date of Service  11/11/2016  HPI/Events of Note  66 year old male admitted with COPD exacerbation. No focal opacity on x-ray imaging. Previously on noninvasive positive pressure ventilation. Camera shows patient with failure is at bedside. Normal respiratory effort and patient speaking in complete sentences to his family.   eICU Interventions  Intensivist team notified of consultation.      Intervention Category Evaluation Type: New Patient Evaluation  Tera Partridge 11/11/2016, 7:58 PM

## 2016-11-12 DIAGNOSIS — J189 Pneumonia, unspecified organism: Secondary | ICD-10-CM

## 2016-11-12 DIAGNOSIS — N179 Acute kidney failure, unspecified: Secondary | ICD-10-CM

## 2016-11-12 DIAGNOSIS — J441 Chronic obstructive pulmonary disease with (acute) exacerbation: Principal | ICD-10-CM

## 2016-11-12 LAB — BASIC METABOLIC PANEL
ANION GAP: 12 (ref 5–15)
BUN: 44 mg/dL — ABNORMAL HIGH (ref 6–20)
CALCIUM: 8.4 mg/dL — AB (ref 8.9–10.3)
CO2: 21 mmol/L — AB (ref 22–32)
CREATININE: 3.37 mg/dL — AB (ref 0.61–1.24)
Chloride: 103 mmol/L (ref 101–111)
GFR calc non Af Amer: 18 mL/min — ABNORMAL LOW (ref >60)
GFR, EST AFRICAN AMERICAN: 20 mL/min — AB (ref >60)
Glucose, Bld: 223 mg/dL — ABNORMAL HIGH (ref 65–99)
Potassium: 3 mmol/L — ABNORMAL LOW (ref 3.5–5.1)
SODIUM: 136 mmol/L (ref 135–145)

## 2016-11-12 LAB — CBC
HCT: 40.3 % (ref 40.0–52.0)
HEMOGLOBIN: 13.8 g/dL (ref 13.0–18.0)
MCH: 32.3 pg (ref 26.0–34.0)
MCHC: 34.2 g/dL (ref 32.0–36.0)
MCV: 94.2 fL (ref 80.0–100.0)
PLATELETS: 216 10*3/uL (ref 150–440)
RBC: 4.28 MIL/uL — AB (ref 4.40–5.90)
RDW: 14.6 % — ABNORMAL HIGH (ref 11.5–14.5)
WBC: 26.8 10*3/uL — AB (ref 3.8–10.6)

## 2016-11-12 LAB — GLUCOSE, CAPILLARY
GLUCOSE-CAPILLARY: 261 mg/dL — AB (ref 65–99)
Glucose-Capillary: 159 mg/dL — ABNORMAL HIGH (ref 65–99)
Glucose-Capillary: 193 mg/dL — ABNORMAL HIGH (ref 65–99)
Glucose-Capillary: 130 mg/dL — ABNORMAL HIGH (ref 65–99)
Glucose-Capillary: 202 mg/dL — ABNORMAL HIGH (ref 65–99)

## 2016-11-12 LAB — TROPONIN I
TROPONIN I: 0.1 ng/mL — AB (ref <0.03)
Troponin I: 0.03 ng/mL (ref <0.03)

## 2016-11-12 LAB — MAGNESIUM: MAGNESIUM: 2.3 mg/dL (ref 1.7–2.4)

## 2016-11-12 MED ORDER — FAMOTIDINE 20 MG PO TABS: 20.0000 mg | ORAL_TABLET | Freq: Two times a day (BID) | ORAL | Status: DC

## 2016-11-12 MED ORDER — SODIUM CHLORIDE 0.9 % IV SOLN
0.0000 ug/min | INTRAVENOUS | Status: DC
  Administered 2016-11-12: 10 ug/min via INTRAVENOUS
  Filled 2016-11-12: qty 1

## 2016-11-12 MED ORDER — SODIUM CHLORIDE 0.9 % IV BOLUS (SEPSIS)
1000.0000 mL | Freq: Once | INTRAVENOUS | Status: AC
  Administered 2016-11-12: 1000 mL via INTRAVENOUS

## 2016-11-12 MED ORDER — POTASSIUM CHLORIDE 20 MEQ PO PACK
60.0000 meq | PACK | Freq: Once | ORAL | Status: AC
  Administered 2016-11-12: 60 meq via ORAL
  Filled 2016-11-12: qty 3

## 2016-11-12 MED ORDER — FAMOTIDINE IN NACL 20-0.9 MG/50ML-% IV SOLN: 20.0000 mg | Freq: Two times a day (BID) | INTRAVENOUS | Status: DC

## 2016-11-12 MED ORDER — CEFTRIAXONE SODIUM 1 G IJ SOLR
1.0000 g | INTRAMUSCULAR | Status: DC
  Administered 2016-11-12 – 2016-11-13 (×2): 1 g via INTRAVENOUS
  Filled 2016-11-12 (×3): qty 10

## 2016-11-12 MED ORDER — FAMOTIDINE 20 MG PO TABS
20.0000 mg | ORAL_TABLET | Freq: Two times a day (BID) | ORAL | Status: DC
  Administered 2016-11-12 – 2016-11-14 (×4): 20 mg via ORAL
  Filled 2016-11-12 (×4): qty 1

## 2016-11-12 NOTE — Progress Notes (Signed)
Clinical Education officer, museum (CSW) received consult for medication assistance. Please consult RN case manager. Please reconsult if future social work needs arise. CSW signing off.   [employer], LCSW [phone]

## 2016-11-12 NOTE — Progress Notes (Signed)
eLink Physician-Brief Progress Note Patient Name: Christian Cline DOB: 1950-10-10 MRN: [id number]   Date of Service  11/12/2016  HPI/Events of Note  Patient on Zantac at home twice daily. Notified by bedside nurse that patient does not have this ordered. Currently on IV Solu-Medrol.   eICU Interventions  Ordering Pepcid 20 mg twice a day in place of home Zantac.      Intervention Category Intermediate Interventions: Other:  Tera Partridge 11/12/2016, 3:58 PM

## 2016-11-12 NOTE — Progress Notes (Addendum)
[place] at [place] NAME: Christian Cline    MR#:  [id number]  DATE OF BIRTH:  [birth year]  SUBJECTIVE:  CHIEF COMPLAINT:   Chief Complaint  Patient presents with  . Shortness of Breath    REVIEW OF SYSTEMS:  Review of Systems  Constitutional: Negative for chills, fever and malaise/fatigue.  HENT: Negative for sore throat.   Eyes: Negative for blurred vision and double vision.  Respiratory: Positive for cough, shortness of breath and wheezing. Negative for hemoptysis and stridor.   Cardiovascular: Negative for chest pain, palpitations, orthopnea and leg swelling.  Gastrointestinal: Negative for abdominal pain, blood in stool, diarrhea, melena, nausea and vomiting.  Genitourinary: Negative for dysuria, flank pain and hematuria.  Musculoskeletal: Negative for back pain and joint pain.  Neurological: Negative for dizziness, sensory change, focal weakness, seizures, loss of consciousness, weakness and headaches.  Endo/Heme/Allergies: Negative for polydipsia.  Psychiatric/Behavioral: Negative for depression. The patient is not nervous/anxious.     DRUG ALLERGIES:  No Known Allergies VITALS:  Blood pressure (!) 70/35, pulse (!) 101, temperature 98.2 F (36.8 C), resp. rate 19, height 5' 9.5" (1.765 m), weight 227 lb 8.2 oz (103.2 kg), SpO2 94 %. PHYSICAL EXAMINATION:  Physical Exam  Constitutional: He is oriented to person, place, and time and well-developed, well-nourished, and in no distress.  obese  HENT:  Head: Normocephalic.  Mouth/Throat: Oropharynx is clear and moist.  Eyes: Pupils are equal, round, and reactive to light. Conjunctivae and EOM are normal. No scleral icterus.  Neck: Normal range of motion. Neck supple. No JVD present. No tracheal deviation present.  Cardiovascular: Normal rate, regular rhythm and normal heart sounds.  Exam reveals no gallop.   No murmur heard. Pulmonary/Chest: Effort normal and breath sounds  normal. No respiratory distress. He has no wheezes. He has no rales.  Abdominal: Soft. Bowel sounds are normal. He exhibits no distension. There is no tenderness. There is no rebound.  Musculoskeletal: Normal range of motion. He exhibits no edema or tenderness.  Neurological: He is alert and oriented to person, place, and time. No cranial nerve deficit.  Skin: No rash noted. No erythema.  Psychiatric: Affect normal.   LABORATORY PANEL:  Male CBC  Recent Labs Lab 11/12/16 0145  WBC 26.8*  HGB 13.8  HCT 40.3  PLT 216   ------------------------------------------------------------------------------------------------------------------ Chemistries   Recent Labs Lab 11/12/16 0145 11/12/16 0806  NA 136  --   K 3.0*  --   CL 103  --   CO2 21*  --   GLUCOSE 223*  --   BUN 44*  --   CREATININE 3.37*  --   CALCIUM 8.4*  --   MG  --  2.3   RADIOLOGY:  Dg Chest 2 View  Result Date: 11/11/2016 CLINICAL DATA:  Shortness of breath. EXAM: CHEST  2 VIEW COMPARISON:  None. FINDINGS: The cardiomediastinal silhouette is normal in size. Normal pulmonary vascularity. Coarsened interstitial markings with diffuse peribronchial thickening. No focal consolidation, pleural effusion, or pneumothorax. No acute osseous abnormality. IMPRESSION: Bronchitic changes.  No consolidation. Electronically Signed   By: Titus Dubin M.D.   On: 11/11/2016 14:03   ASSESSMENT AND PLAN:   Acute respiratory failure with hypoxia due to COPD exacerbation Off BIPAP and O2 [place] this am. continue IV steroids, bronchodilators, Zithromax and Rocephin.  #2 proximal atrial fibrillation: Patient follows with Dr. Ubaldo Glassing continue full dose anticoagulation with Eiquis,, verapamil 360 mg by mouth daily,  #3 . ARF  on CKD stage 3. Baseline creatinine around 1.7. Worsening.  Continue IV fluid support and follow-up BMP.  Patient has history of renal stones, according to him patient has embedded stones in the left kidney and they  are not operable.  Hypotension. Due to dehydration. Try to wean off Neo drip. Continue normal saline IV.  Leukocytosis.due to COPD exacerbation. Chest x-ray didn't show any infiltrate. Continue antibiotics and Follow-up CBC.  Hypokalemia. Given potassium supplement and follow-up BMP. Magnesium is normal.  Tobacco abuse: Counselled smoking cessation for 4 minutes and on nicotine patch.  All the records are reviewed and case discussed with Care Management/Social Worker. Management plans discussed with the patient, his son and they are in agreement.  CODE STATUS: Full Code  TOTAL TIME TAKING CARE OF THIS PATIENT: 39 minutes.   More than 50% of the time was spent in counseling/coordination of care: YES  POSSIBLE D/C IN  3 DAYS, DEPENDING ON CLINICAL CONDITION.   Demetrios Loll M.D on 11/12/2016 at 1:23 PM  Between 7am to 6pm - Pager - [phone]  After 6pm go to [email or web address] - [facility]

## 2016-11-12 NOTE — Progress Notes (Signed)
Patient resting intermittanly overnight, Bipap removed at 2010 patient tolerated well and maintained SPO2 WDL. Currently maintaining saturations on RA.  Decreased BP with MAP below 65 2L NS bolus given with minimal result. Neosynephrine started at 3mcg  Will continue to assess for changes/patient need.

## 2016-11-12 NOTE — Progress Notes (Addendum)
[place] for electrolytes Indication: hypokalemia  No Known Allergies  Patient Measurements: Height: 5' 9.5" (176.5 cm) Weight: 227 lb 8.2 oz (103.2 kg) IBW/kg (Calculated) : 71.85   Vital Signs: Temp: 98.2 F (36.8 C) (09/01 0800) Temp Source: Axillary (09/01 0119) BP: 70/35 (09/01 1000) Pulse Rate: 101 (09/01 1000) Intake/Output from previous day: 08/31 0701 - 09/01 0700 In: 2581.5 [P.O.:150; I.V.:431.5; IV Piggyback:2000] Out: 525 [Urine:525] Intake/Output from this shift: Total I/O In: -  Out: 250 [Urine:250]  Labs:  Recent Labs  11/11/16 1324 11/12/16 0145  WBC 30.4* 26.8*  HGB 16.9 13.8  HCT 50.2 40.3  PLT 254 216  CREATININE 2.64* 3.37*   Estimated Creatinine Clearance: 25.7 mL/min (A) (by C-G formula based on SCr of 3.37 mg/dL (H)).    Assessment: K 3.0 - received 60 mEq PO x1  Goal of Therapy:  K 3.5-5.0 Mag 1.8-2.1  Plan:  Add on mag Recheck labs in AM  Rayna Sexton L 11/12/2016,12:24 PM   Mag 2.3 - no supp needed Rayna Sexton, PharmD, BCPS Clinical Pharmacist 11/12/2016 1:43 PM

## 2016-11-12 NOTE — Progress Notes (Signed)
Neo titrated off. Pt's bp 110/90.  Remains on RA with sat's 93.  Had c/o earlier with indigestion...order rec'd from [facility] for prilosec.  Bubba Camp, RN

## 2016-11-12 NOTE — Progress Notes (Signed)
Pharmacy Antibiotic Note  Christian Cline is a 66 y.o. male admitted on 11/11/2016 with COPD exacerbation/empiric abx.  Pharmacy has been consulted for ceftriaxone/azithromycin dosing. Pt received azithromycin and ceftriaxone in the ED.   Plan: Continue azithromycin 500mg  IV Q24hr. Will continue to evaluate for IV to PO conversion.   Continue ceftriaxone 1g IV Q24hr.    Recommend obtaining procalcitonin levels to help guide antibiotic therapy.   Height: 5' 9.5" (176.5 cm) Weight: 227 lb 8.2 oz (103.2 kg) IBW/kg (Calculated) : 71.85  Temp (24hrs), Avg:98 F (36.7 C), Min:97.1 F (36.2 C), Max:98.4 F (36.9 C)   Recent Labs Lab 11/11/16 1324 11/11/16 1800 11/11/16 1939 11/12/16 0145  WBC 30.4*  --   --  26.8*  CREATININE 2.64*  --   --  3.37*  LATICACIDVEN  --  2.1* 1.9  --     Estimated Creatinine Clearance: 25.7 mL/min (A) (by C-G formula based on SCr of 3.37 mg/dL (H)).    No Known Allergies  Antimicrobials this admission: Ceftriaxone 8/31 >> Azithromycin 8/31 >>  Dose adjustments this admission:   Microbiology results: 8/31 BCx: no growth < 12 hours  8/31 MRSA PCR: sent  Thank you for allowing pharmacy to be a part of this patient's care.  Marissa Lowrey L 11/12/2016 8:27 AM

## 2016-11-13 LAB — MAGNESIUM: Magnesium: 2.1 mg/dL (ref 1.7–2.4)

## 2016-11-13 LAB — CBC
HCT: 43.9 % (ref 40.0–52.0)
HEMOGLOBIN: 14.6 g/dL (ref 13.0–18.0)
MCH: 31.7 pg (ref 26.0–34.0)
MCHC: 33.3 g/dL (ref 32.0–36.0)
MCV: 95.3 fL (ref 80.0–100.0)
Platelets: 255 10*3/uL (ref 150–440)
RBC: 4.61 MIL/uL (ref 4.40–5.90)
RDW: 14.9 % — ABNORMAL HIGH (ref 11.5–14.5)
WBC: 39.7 10*3/uL — ABNORMAL HIGH (ref 3.8–10.6)

## 2016-11-13 LAB — GLUCOSE, CAPILLARY
GLUCOSE-CAPILLARY: 178 mg/dL — AB (ref 65–99)
Glucose-Capillary: 140 mg/dL — ABNORMAL HIGH (ref 65–99)
Glucose-Capillary: 147 mg/dL — ABNORMAL HIGH (ref 65–99)
Glucose-Capillary: 150 mg/dL — ABNORMAL HIGH (ref 65–99)
Glucose-Capillary: 164 mg/dL — ABNORMAL HIGH (ref 65–99)
Glucose-Capillary: 187 mg/dL — ABNORMAL HIGH (ref 65–99)

## 2016-11-13 LAB — BASIC METABOLIC PANEL
ANION GAP: 8 (ref 5–15)
BUN: 54 mg/dL — AB (ref 6–20)
CALCIUM: 8.8 mg/dL — AB (ref 8.9–10.3)
CHLORIDE: 107 mmol/L (ref 101–111)
CO2: 22 mmol/L (ref 22–32)
Creatinine, Ser: 2.39 mg/dL — ABNORMAL HIGH (ref 0.61–1.24)
GFR, EST AFRICAN AMERICAN: 31 mL/min — AB (ref >60)
GFR, EST NON AFRICAN AMERICAN: 27 mL/min — AB (ref >60)
GLUCOSE: 161 mg/dL — AB (ref 65–99)
POTASSIUM: 3.8 mmol/L (ref 3.5–5.1)
SODIUM: 137 mmol/L (ref 135–145)

## 2016-11-13 LAB — PROCALCITONIN: PROCALCITONIN: 0.64 ng/mL

## 2016-11-13 LAB — HEMOGLOBIN A1C
Hgb A1c MFr Bld: 6 % — ABNORMAL HIGH (ref 4.8–5.6)
Mean Plasma Glucose: 125.5 mg/dL

## 2016-11-13 MED ORDER — ALUM & MAG HYDROXIDE-SIMETH 200-200-20 MG/5ML PO SUSP: 30.0000 mL | Freq: Four times a day (QID) | ORAL | Status: DC | PRN

## 2016-11-13 MED ORDER — IPRATROPIUM-ALBUTEROL 0.5-2.5 (3) MG/3ML IN SOLN
3.0000 mL | Freq: Four times a day (QID) | RESPIRATORY_TRACT | Status: DC
  Administered 2016-11-13 (×2): 3 mL via RESPIRATORY_TRACT
  Filled 2016-11-13 (×2): qty 3

## 2016-11-13 NOTE — Progress Notes (Signed)
[place] NOTE   Pharmacy Consult for electrolytes Indication: hypokalemia  No Known Allergies  Patient Measurements: Height: 5' 9.5" (176.5 cm) Weight: 227 lb 8.2 oz (103.2 kg) IBW/kg (Calculated) : 71.85   Vital Signs: Temp: 97.8 F (36.6 C) (09/02 1328) Temp Source: Oral (09/02 1328) BP: 113/64 (09/02 1328) Pulse Rate: 89 (09/02 1328) Intake/Output from previous day: 09/01 0701 - 09/02 0700 In: 1252.5 [I.V.:1252.5] Out: 1450 [Urine:1450] Intake/Output from this shift: Total I/O In: -  Out: 225 [Urine:225]  Labs:  Recent Labs  11/11/16 1324 11/12/16 0145 11/12/16 0806 11/13/16 0335  WBC 30.4* 26.8*  --  39.7*  HGB 16.9 13.8  --  14.6  HCT 50.2 40.3  --  43.9  PLT 254 216  --  255  CREATININE 2.64* 3.37*  --  2.39*  MG  --   --  2.3 2.1   Estimated Creatinine Clearance: 36.3 mL/min (A) (by C-G formula based on SCr of 2.39 mg/dL (H)).    Assessment: K 3.8, Mag 2.1  Goal of Therapy:  K 3.5-5.0 Mag 1.8-2.1  Plan:  No supplementation needed today. BMP already ordered for AM.  Recheck labs in AM  Rayna Sexton L 11/13/2016,2:48 PM

## 2016-11-13 NOTE — Progress Notes (Signed)
Brief Note Patient weaned off vasopressors and is being transferred out from ICU. Will sign off. Please call with questions.

## 2016-11-13 NOTE — Progress Notes (Signed)
Report called to [facility] on 1A.  Pt being transported to room 148 via wheelchair w/o complaints and with family.  Bubba Camp, RN

## 2016-11-13 NOTE — Progress Notes (Signed)
[place] at [place] NAME: Christian Cline    MR#:  [id number]  DATE OF BIRTH:  06/18/1950  SUBJECTIVE:  CHIEF COMPLAINT:   Chief Complaint  Patient presents with  . Shortness of Breath   Still cough, better SOB, no wheezing. Off Neo drip. REVIEW OF SYSTEMS:  Review of Systems  Constitutional: Negative for chills, fever and malaise/fatigue.  HENT: Negative for sore throat.   Eyes: Negative for blurred vision and double vision.  Respiratory: Positive for cough and shortness of breath. Negative for hemoptysis, wheezing and stridor.   Cardiovascular: Negative for chest pain, palpitations, orthopnea and leg swelling.  Gastrointestinal: Negative for abdominal pain, blood in stool, diarrhea, melena, nausea and vomiting.  Genitourinary: Negative for dysuria, flank pain and hematuria.  Musculoskeletal: Negative for back pain and joint pain.  Neurological: Negative for dizziness, sensory change, focal weakness, seizures, loss of consciousness, weakness and headaches.  Endo/Heme/Allergies: Negative for polydipsia.  Psychiatric/Behavioral: Negative for depression. The patient is not nervous/anxious.     DRUG ALLERGIES:  No Known Allergies VITALS:  Blood pressure (!) 100/58, pulse 72, temperature 97.8 F (36.6 C), resp. rate 16, height 5' 9.5" (1.765 m), weight 227 lb 8.2 oz (103.2 kg), SpO2 97 %. PHYSICAL EXAMINATION:  Physical Exam  Constitutional: He is oriented to person, place, and time and well-developed, well-nourished, and in no distress.  obese  HENT:  Head: Normocephalic.  Mouth/Throat: Oropharynx is clear and moist.  Eyes: Pupils are equal, round, and reactive to light. Conjunctivae and EOM are normal. No scleral icterus.  Neck: Normal range of motion. Neck supple. No JVD present. No tracheal deviation present.  Cardiovascular: Normal rate, regular rhythm and normal heart sounds.  Exam reveals no gallop.   No murmur  heard. Pulmonary/Chest: Effort normal and breath sounds normal. No respiratory distress. He has no wheezes. He has no rales.  Abdominal: Soft. Bowel sounds are normal. He exhibits no distension. There is no tenderness. There is no rebound.  Musculoskeletal: Normal range of motion. He exhibits no edema or tenderness.  Neurological: He is alert and oriented to person, place, and time. No cranial nerve deficit.  Skin: No rash noted. No erythema.  Psychiatric: Affect normal.   LABORATORY PANEL:  Male CBC  Recent Labs Lab 11/13/16 0335  WBC 39.7*  HGB 14.6  HCT 43.9  PLT 255   ------------------------------------------------------------------------------------------------------------------ Chemistries   Recent Labs Lab 11/13/16 0335  NA 137  K 3.8  CL 107  CO2 22  GLUCOSE 161*  BUN 54*  CREATININE 2.39*  CALCIUM 8.8*  MG 2.1   RADIOLOGY:  No results found. ASSESSMENT AND PLAN:   Acute respiratory failure with hypoxia due to COPD exacerbation Off BIPAP and O2 [place] this am. May discontiue IV steroids, change bronchodilators, Zithromax and Rocephin.  #2 proximal atrial fibrillation: Patient follows with Dr. Ubaldo Glassing continue full dose anticoagulation with Eiquis,, verapamil 360 mg by mouth daily,  #3 . ARF on CKD stage 3. Baseline creatinine around 1.7.  Improving with IV fluid support and follow-up BMP.  Patient has history of renal stones, according to him patient has embedded stones in the left kidney and they are not operable.  Hypotension. Due to dehydration. off Neo drip.  Leukocytosis.due to COPD exacerbation and steroid. Chest x-ray didn't show any infiltrate. Continue antibiotics and Follow-up CBC.  Hypokalemia. Given potassium supplement and improved. Magnesium is normal.  Tobacco abuse: Counselled smoking cessation for 4 minutes and on nicotine patch.  All the records are reviewed and case discussed with Care Management/Social Worker. Management plans  discussed with the patient, his son and they are in agreement.  CODE STATUS: Full Code  TOTAL TIME TAKING CARE OF THIS PATIENT: 33 minutes.   More than 50% of the time was spent in counseling/coordination of care: YES  POSSIBLE D/C IN  2 DAYS, DEPENDING ON CLINICAL CONDITION.   Demetrios Loll M.D on 11/13/2016 at 12:10 PM  Between 7am to 6pm - Pager - [phone]  After 6pm go to [email or web address] - [facility]

## 2016-11-14 LAB — CBC
HEMATOCRIT: 40.8 % (ref 40.0–52.0)
HEMOGLOBIN: 13.8 g/dL (ref 13.0–18.0)
MCH: 32 pg (ref 26.0–34.0)
MCHC: 33.8 g/dL (ref 32.0–36.0)
MCV: 94.7 fL (ref 80.0–100.0)
Platelets: 244 10*3/uL (ref 150–440)
RBC: 4.3 MIL/uL — AB (ref 4.40–5.90)
RDW: 14.4 % (ref 11.5–14.5)
WBC: 27.8 10*3/uL — AB (ref 3.8–10.6)

## 2016-11-14 LAB — GLUCOSE, CAPILLARY
GLUCOSE-CAPILLARY: 117 mg/dL — AB (ref 65–99)
Glucose-Capillary: 113 mg/dL — ABNORMAL HIGH (ref 65–99)

## 2016-11-14 LAB — BASIC METABOLIC PANEL
ANION GAP: 6 (ref 5–15)
BUN: 55 mg/dL — AB (ref 6–20)
CHLORIDE: 110 mmol/L (ref 101–111)
CO2: 23 mmol/L (ref 22–32)
Calcium: 8.6 mg/dL — ABNORMAL LOW (ref 8.9–10.3)
Creatinine, Ser: 2.14 mg/dL — ABNORMAL HIGH (ref 0.61–1.24)
GFR calc Af Amer: 35 mL/min — ABNORMAL LOW (ref >60)
GFR calc non Af Amer: 30 mL/min — ABNORMAL LOW (ref >60)
GLUCOSE: 150 mg/dL — AB (ref 65–99)
POTASSIUM: 3.6 mmol/L (ref 3.5–5.1)
Sodium: 139 mmol/L (ref 135–145)

## 2016-11-14 LAB — PROCALCITONIN: Procalcitonin: 0.47 ng/mL

## 2016-11-14 MED ORDER — ALBUTEROL SULFATE HFA 108 (90 BASE) MCG/ACT IN AERS: 2.0000 | INHALATION_SPRAY | Freq: Four times a day (QID) | RESPIRATORY_TRACT | 2 refills | Status: DC | PRN

## 2016-11-14 MED ORDER — INSULIN ASPART 100 UNIT/ML ~~LOC~~ SOLN: 0.0000 [IU] | Freq: Three times a day (TID) | SUBCUTANEOUS | Status: DC

## 2016-11-14 MED ORDER — PREDNISONE 10 MG PO TABS: ORAL_TABLET | ORAL | 0 refills | Status: DC

## 2016-11-14 MED ORDER — FLUTICASONE-SALMETEROL 250-50 MCG/DOSE IN AEPB: 1.0000 | INHALATION_SPRAY | Freq: Two times a day (BID) | RESPIRATORY_TRACT | 0 refills | Status: AC

## 2016-11-14 MED ORDER — AZITHROMYCIN 500 MG PO TABS
500.0000 mg | ORAL_TABLET | Freq: Every day | ORAL | Status: DC
  Administered 2016-11-14: 500 mg via ORAL
  Filled 2016-11-14: qty 1

## 2016-11-14 MED ORDER — FLUTICASONE-SALMETEROL 250-50 MCG/DOSE IN AEPB: 1.0000 | INHALATION_SPRAY | Freq: Two times a day (BID) | RESPIRATORY_TRACT | 0 refills | Status: DC

## 2016-11-14 MED ORDER — AZITHROMYCIN 500 MG PO TABS
500.0000 mg | ORAL_TABLET | Freq: Every day | ORAL | 0 refills | Status: DC
Start: 2016-11-15 — End: 2018-03-16

## 2016-11-14 MED ORDER — IPRATROPIUM-ALBUTEROL 0.5-2.5 (3) MG/3ML IN SOLN
3.0000 mL | Freq: Three times a day (TID) | RESPIRATORY_TRACT | Status: DC
  Administered 2016-11-14: 3 mL via RESPIRATORY_TRACT
  Filled 2016-11-14: qty 3

## 2016-11-14 MED ORDER — PREDNISONE 50 MG PO TABS
50.0000 mg | ORAL_TABLET | Freq: Every day | ORAL | Status: DC
  Administered 2016-11-14: 50 mg via ORAL
  Filled 2016-11-14: qty 1

## 2016-11-14 MED ORDER — AZITHROMYCIN 500 MG PO TABS: 500.0000 mg | ORAL_TABLET | Freq: Every day | ORAL | 0 refills | Status: DC

## 2016-11-14 NOTE — Care Management Important Message (Signed)
Important Message  Patient Details  Name: Christian Cline MRN: [id number] Date of Birth: 08-Nov-1950   Medicare Important Message Given:  Yes    Jolly Mango, RN 11/14/2016, 10:45 AM

## 2016-11-14 NOTE — Discharge Summary (Signed)
[place] at [place] NAME: Christian Cline    MR#:  [id number]  DATE OF BIRTH:  1950/10/23  DATE OF ADMISSION:  11/11/2016   ADMITTING PHYSICIAN: Epifanio Lesches, MD  DATE OF DISCHARGE: 11/14/2016 11:40 AM  PRIMARY CARE PHYSICIAN: Renee Rival, NP   ADMISSION DIAGNOSIS:  COPD exacerbation ([place]) [J44.1] AKI (acute kidney injury) ([place]) [N17.9] Community acquired pneumonia, unspecified laterality [J18.9] DISCHARGE DIAGNOSIS:  Active Problems:   Acute respiratory failure ([place])  SECONDARY DIAGNOSIS:   Past Medical History:  Diagnosis Date  . COPD (chronic obstructive pulmonary disease) ([place])    HOSPITAL COURSE:   Acute respiratory failure with hypoxia due to COPD exacerbation Off BIPAP and O2 [place].Marland Kitchen Discontiued IV steroids, changed to prednisone taper, continue bronchodilators, Discontinue iv Zithromax and Rocephin, change to po zithromax.  #2 proximal atrial fibrillation: Patient follows with Dr. Sibyl Parr full dose anticoagulation with Eiquis, verapamil 360 mg by mouth daily,  #3 . ARF on CKD stage 3. Baseline creatinine around 1.7.  Improving with IV fluid support and follow-up BMP with PCP.  Hold chlorthalidone and quinapril for now.  Hypotension. Due to dehydration. off Neo drip. Improved.  Leukocytosis.due to COPD exacerbation and steroid. Chest x-ray didn't show any infiltrate. Continue antibiotics and Follow-up CBC with PCP.  Hypokalemia. Given potassium supplement and improved. Magnesium is normal.  Tobacco abuse: Counselled smoking cessation for 4 minutes and on nicotine patch.  DM2, HbA1C 6.0. ADA diet. DISCHARGE CONDITIONS:  Stable, discharge to home today. CONSULTS OBTAINED:   DRUG ALLERGIES:  No Known Allergies DISCHARGE MEDICATIONS:   Allergies as of 11/14/2016   No Known Allergies     Medication List    STOP taking these medications   chlorthalidone 25 MG tablet Commonly known as:   HYGROTON   quinapril 40 MG tablet Commonly known as:  ACCUPRIL     TAKE these medications   albuterol 108 (90 Base) MCG/ACT inhaler Commonly known as:  PROVENTIL HFA;VENTOLIN HFA Inhale 2 puffs into the lungs every 6 (six) hours as needed for wheezing or shortness of breath.   allopurinol 300 MG tablet Commonly known as:  ZYLOPRIM Take 1 tablet by mouth daily.   aspirin EC 81 MG tablet Take 4 tablets by mouth daily.   atorvastatin 40 MG tablet Commonly known as:  LIPITOR Take 1 tablet by mouth daily.   azithromycin 500 MG tablet Commonly known as:  ZITHROMAX Take 1 tablet (500 mg total) by mouth daily.   COMBIVENT RESPIMAT 20-100 MCG/ACT Aers respimat Generic drug:  Ipratropium-Albuterol Inhale 1 puff into the lungs every 6 (six) hours.   docusate sodium 100 MG capsule Commonly known as:  COLACE Take 100 mg by mouth daily as needed for mild constipation.   ELIQUIS 5 MG Tabs tablet Generic drug:  apixaban Take 1 tablet by mouth 2 (two) times daily.   Fluticasone-Salmeterol 250-50 MCG/DOSE Aepb Commonly known as:  ADVAIR DISKUS Inhale 1 puff into the lungs 2 (two) times daily.   meclizine 25 MG tablet Commonly known as:  ANTIVERT Take 1 tablet by mouth 3 (three) times daily as needed.   predniSONE 10 MG tablet Commonly known as:  DELTASONE 40 mg po daily for 2 days, 20 mg po daily for 2 days, 10 mg po daily for 2 days.   ranitidine 150 MG tablet Commonly known as:  ZANTAC Take 150 mg by mouth 2 (two) times daily.   verapamil 360 MG 24 hr capsule Commonly known as:  VERELAN PM Take 1 capsule by mouth daily.            Discharge Care Instructions        Start     Ordered   11/15/16 0000  azithromycin (ZITHROMAX) 500 MG tablet  Daily     11/14/16 1054   11/15/16 0000  predniSONE (DELTASONE) 10 MG tablet     11/14/16 1054   11/14/16 0000  Increase activity slowly     11/14/16 1039   11/14/16 0000  Diet - low sodium heart healthy     11/14/16 1039     11/14/16 0000  albuterol (PROVENTIL HFA;VENTOLIN HFA) 108 (90 Base) MCG/ACT inhaler  Every 6 hours PRN     11/14/16 1054   11/14/16 0000  Fluticasone-Salmeterol (ADVAIR DISKUS) 250-50 MCG/DOSE AEPB  2 times daily     11/14/16 1054       DISCHARGE INSTRUCTIONS:  See AVS.  If you experience worsening of your admission symptoms, develop shortness of breath, life threatening emergency, suicidal or homicidal thoughts you must seek medical attention immediately by calling 911 or calling your MD immediately  if symptoms less severe.  You Must read complete instructions/literature along with all the possible adverse reactions/side effects for all the Medicines you take and that have been prescribed to you. Take any new Medicines after you have completely understood and accpet all the possible adverse reactions/side effects.   Please note  You were cared for by a hospitalist during your hospital stay. If you have any questions about your discharge medications or the care you received while you were in the hospital after you are discharged, you can call the unit and asked to speak with the hospitalist on call if the hospitalist that took care of you is not available. Once you are discharged, your primary care physician will handle any further medical issues. Please note that NO REFILLS for any discharge medications will be authorized once you are discharged, as it is imperative that you return to your primary care physician (or establish a relationship with a primary care physician if you do not have one) for your aftercare needs so that they can reassess your need for medications and monitor your lab values.    On the day of Discharge:  VITAL SIGNS:  Blood pressure 118/72, pulse 68, temperature 97.8 F (36.6 C), temperature source Oral, resp. rate 20, height 5' 9.5" (1.765 m), weight 244 lb (110.7 kg), SpO2 99 %. PHYSICAL EXAMINATION:  GENERAL:  66 y.o.-year-old patient lying in the bed with no  acute distress. Obese. EYES: Pupils equal, round, reactive to light and accommodation. No scleral icterus. Extraocular muscles intact.  HEENT: Head atraumatic, normocephalic. Oropharynx and nasopharynx clear.  NECK:  Supple, no jugular venous distention. No thyroid enlargement, no tenderness.  LUNGS: Normal breath sounds bilaterally, no wheezing, rales,rhonchi or crepitation. No use of accessory muscles of respiration.  CARDIOVASCULAR: S1, S2 normal. No murmurs, rubs, or gallops.  ABDOMEN: Soft, non-tender, non-distended. Bowel sounds present. No organomegaly or mass.  EXTREMITIES: No pedal edema, cyanosis, or clubbing.  NEUROLOGIC: Cranial nerves II through XII are intact. Muscle strength 5/5 in all extremities. Sensation intact. Gait not checked.  PSYCHIATRIC: The patient is alert and oriented x 3.  SKIN: No obvious rash, lesion, or ulcer.  DATA REVIEW:   CBC  Recent Labs Lab 11/14/16 0253  WBC 27.8*  HGB 13.8  HCT 40.8  PLT 244    Chemistries   Recent Labs Lab 11/13/16 0335 11/14/16  0253  NA 137 139  K 3.8 3.6  CL 107 110  CO2 22 23  GLUCOSE 161* 150*  BUN 54* 55*  CREATININE 2.39* 2.14*  CALCIUM 8.8* 8.6*  MG 2.1  --      Microbiology Results  Results for orders placed or performed during the hospital encounter of 11/11/16  Blood culture (routine x 2)     Status: None (Preliminary result)   Collection Time: 11/11/16  6:00 PM  Result Value Ref Range Status   Specimen Description BLOOD RIGHT ANTECUBITAL  Final   Special Requests   Final    BOTTLES DRAWN AEROBIC AND ANAEROBIC Blood Culture results may not be optimal due to an excessive volume of blood received in culture bottles   Culture NO GROWTH 3 DAYS  Final   Report Status PENDING  Incomplete  Blood culture (routine x 2)     Status: None (Preliminary result)   Collection Time: 11/11/16  6:02 PM  Result Value Ref Range Status   Specimen Description BLOOD BLOOD RIGHT HAND  Final   Special Requests   Final      BOTTLES DRAWN AEROBIC AND ANAEROBIC Blood Culture results may not be optimal due to an excessive volume of blood received in culture bottles   Culture NO GROWTH 3 DAYS  Final   Report Status PENDING  Incomplete  MRSA PCR Screening     Status: None   Collection Time: 11/11/16  6:52 PM  Result Value Ref Range Status   MRSA by PCR NEGATIVE NEGATIVE Final    Comment:        The GeneXpert MRSA Assay (FDA approved for NASAL specimens only), is one component of a comprehensive MRSA colonization surveillance program. It is not intended to diagnose MRSA infection nor to guide or monitor treatment for MRSA infections.     RADIOLOGY:  No results found.   Management plans discussed with the patient, family and they are in agreement.  CODE STATUS: Full Code   TOTAL TIME TAKING CARE OF THIS PATIENT: 35 minutes.    Demetrios Loll M.D on 11/14/2016 at 12:55 PM  Between 7am to 6pm - Pager - [phone]  After 6pm go to [email or web address] - Proofreader  [employer] [facility] [place] [facility]  Office  [phone]  CC: Primary care physician; Renee Rival, NP   Note: This dictation was prepared with Dragon dictation along with smaller phrase technology. Any transcriptional errors that result from this process are unintentional.

## 2016-11-14 NOTE — Discharge Instructions (Signed)
Heart healthy and ADA diet. °Smoking cessation. °

## 2016-11-14 NOTE — Care Management (Signed)
Im given. Met with patient and family at bedside. Patient sitting on side of bed. No 02. Independent and active. Denies issues or difficulties. Case closed.

## 2016-11-14 NOTE — Progress Notes (Signed)
Pt discharged to home via wheelchair accompanied by family without incident per MD order. Prior to discharge, all discharge teachings done both written and verbal. All questions answered. Pt verb understanding of all teachings and agrees to comply. Pt discharged with prescriptions for Zithromax, deltasone, Proventil inhaler, and Advair. Pt able to verbalize the importance of follow up with [facility] NP in one week. No change in pt from AM assessment on discharge. Pt controlled on discharge.

## 2016-11-16 LAB — CULTURE, BLOOD (ROUTINE X 2)
Culture: NO GROWTH
Culture: NO GROWTH

## 2017-03-31 ENCOUNTER — Ambulatory Visit: Payer: Medicare Other | Admitting: Urology

## 2017-03-31 ENCOUNTER — Encounter: Payer: Self-pay | Admitting: Urology

## 2017-03-31 ENCOUNTER — Other Ambulatory Visit: Payer: Self-pay

## 2017-03-31 VITALS — BP 135/82 | HR 86 | Ht 70.0 in | Wt 235.0 lb

## 2017-03-31 DIAGNOSIS — R972 Elevated prostate specific antigen [PSA]: Secondary | ICD-10-CM | POA: Diagnosis not present

## 2017-03-31 NOTE — Progress Notes (Signed)
03/31/2017 10:54 AM   Christian Cline 11-20-1950 [id number]  Referring provider: Renee Rival, NP [place], [place] [address]  Chief Complaint  Patient presents with  . New Patient (Initial Visit)    HPI: The patient is a 67 year old gentleman who presents today to discuss his elevated PSA.  His PSA was 12.04 in December 2018.  He believes that it was around 4 prior to this.  It is never been this high.  He has no genitourinary complaints.  Does have a history of nephrolithiasis and is seeing Dr. Kandee Keen at [facility] in the past many years ago.  He has no symptoms of nephrolithiasis at this time.  He voids well without gross hematuria.  He has no family history of prostate cancer.  He has no history of prostate biopsy   PMH: Past Medical History:  Diagnosis Date  . Acid reflux   . Arthritis   . COPD (chronic obstructive pulmonary disease) ([place])   . Gout   . Heart murmur   . Hyperlipidemia   . Hypertension   . Sleep apnea     Surgical History: Kidney stone surgery  Home Medications:  Allergies as of 03/31/2017   No Known Allergies     Medication List        Accurate as of 03/31/17 10:54 AM. Always use your most recent med list.          albuterol 108 (90 Base) MCG/ACT inhaler Commonly known as:  PROVENTIL HFA;VENTOLIN HFA Inhale 2 puffs into the lungs every 6 (six) hours as needed for wheezing or shortness of breath.   allopurinol 300 MG tablet Commonly known as:  ZYLOPRIM Take 1 tablet by mouth daily.   aspirin EC 81 MG tablet Take 4 tablets by mouth daily.   atorvastatin 40 MG tablet Commonly known as:  LIPITOR Take 1 tablet by mouth daily.   azithromycin 500 MG tablet Commonly known as:  ZITHROMAX Take 1 tablet (500 mg total) by mouth daily.   celecoxib 200 MG capsule Commonly known as:  CELEBREX Take 200 mg by mouth 2 (two) times daily.   chlorthalidone 25 MG tablet Commonly known as:  HYGROTON Take by mouth.   COMBIVENT  RESPIMAT 20-100 MCG/ACT Aers respimat Generic drug:  Ipratropium-Albuterol Inhale 1 puff into the lungs every 6 (six) hours.   docusate sodium 100 MG capsule Commonly known as:  COLACE Take 100 mg by mouth daily as needed for mild constipation.   ELIQUIS 5 MG Tabs tablet Generic drug:  apixaban Take 1 tablet by mouth 2 (two) times daily.   Fluticasone-Salmeterol 250-50 MCG/DOSE Aepb Commonly known as:  ADVAIR DISKUS Inhale 1 puff into the lungs 2 (two) times daily.   gabapentin 300 MG capsule Commonly known as:  NEURONTIN TAKE 1 CAPSULE BY MOUTH THREE TIMES A DAY   meclizine 25 MG tablet Commonly known as:  ANTIVERT Take 1 tablet by mouth 3 (three) times daily as needed.   predniSONE 10 MG tablet Commonly known as:  DELTASONE 40 mg po daily for 2 days, 20 mg po daily for 2 days, 10 mg po daily for 2 days.   quinapril 40 MG tablet Commonly known as:  ACCUPRIL   ranitidine 150 MG tablet Commonly known as:  ZANTAC Take 150 mg by mouth 2 (two) times daily.   verapamil 360 MG 24 hr capsule Commonly known as:  VERELAN PM Take 1 capsule by mouth daily.       Allergies: No Known  Allergies  Family History: Family History  Problem Relation Age of Onset  . Kidney cancer Brother   . Kidney disease Neg Hx   . Prostate cancer Neg Hx     Social History:  reports that he has been smoking cigarettes.  He has been smoking about 1.50 packs per day. he has never used smokeless tobacco. He reports that he does not drink alcohol or use drugs.  ROS: UROLOGY Frequent Urination?: Yes Hard to postpone urination?: Yes Burning/pain with urination?: No Get up at night to urinate?: Yes Leakage of urine?: Yes Urine stream starts and stops?: Yes Trouble starting stream?: No Do you have to strain to urinate?: No Blood in urine?: No Urinary tract infection?: No Sexually transmitted disease?: No Injury to kidneys or bladder?: No Painful intercourse?: No Weak stream?: Yes Erection  problems?: Yes Penile pain?: No  Gastrointestinal Nausea?: No Vomiting?: No Indigestion/heartburn?: Yes Diarrhea?: No Constipation?: Yes  Constitutional Fever: No Night sweats?: No Weight loss?: No Fatigue?: Yes  Skin Skin rash/lesions?: No Itching?: No  Eyes Blurred vision?: Yes Double vision?: No  Ears/Nose/Throat Sore throat?: No Sinus problems?: No  Hematologic/Lymphatic Swollen glands?: No Easy bruising?: No  Cardiovascular Leg swelling?: No Chest pain?: No  Respiratory Cough?: No Shortness of breath?: Yes  Endocrine Excessive thirst?: No  Musculoskeletal Back pain?: Yes Joint pain?: No  Neurological Headaches?: No Dizziness?: Yes  Psychologic Depression?: No Anxiety?: No  Physical Exam: BP 135/82   Pulse 86   Ht 5\' 10"  (1.778 m)   Wt 235 lb (106.6 kg)   BMI 33.72 kg/m   Constitutional:  Alert and oriented, No acute distress. HEENT: [place] AT, moist mucus membranes.  Trachea midline, no masses. Cardiovascular: No clubbing, cyanosis, or edema. Respiratory: Normal respiratory effort, no increased work of breathing. GI: Abdomen is soft, nontender, nondistended, no abdominal masses GU: No CVA tenderness.  Normal phallus.  Testicles descended bilaterally.  Benign.  DRE: 2+ smooth benign Skin: No rashes, bruises or suspicious lesions. Lymph: No cervical or inguinal adenopathy. Neurologic: Grossly intact, no focal deficits, moving all 4 extremities. Psychiatric: Normal mood and affect.  Laboratory Data: Lab Results  Component Value Date   WBC 27.8 (H) 11/14/2016   HGB 13.8 11/14/2016   HCT 40.8 11/14/2016   MCV 94.7 11/14/2016   PLT 244 11/14/2016    Lab Results  Component Value Date   CREATININE 2.14 (H) 11/14/2016    No results found for: PSA  No results found for: TESTOSTERONE  Lab Results  Component Value Date   HGBA1C 6.0 (H) 11/13/2016    Urinalysis No results found for: COLORURINE, APPEARANCEUR, LABSPEC, PHURINE,  GLUCOSEU, HGBUR, BILIRUBINUR, KETONESUR, PROTEINUR, UROBILINOGEN, NITRITE, LEUKOCYTESUR   Assessment & Plan:    1.  Elevated PSA I discussed with the patient that his PSA is markedly elevated from normal.  We did discuss a prostate biopsy after confirming this is a true elevation with repeat PSA.  We discussed the risk, benefits, indications of this.  We discussed the risks include but not limited to bleeding and infection.  Understands any blood in his urine and stool for up to 48 hours in his semen for up to 6 weeks.  He also understands the risk of approximately 1% of sepsis after this procedure despite periprocedural antibiotics.  All questions were answered.  The patient has agreed to proceed.  Return for prostate biopsy.  Nickie Retort, MD  [facility] [address], [place], [place] [address] [phone]

## 2017-04-01 LAB — PSA: PROSTATE SPECIFIC AG, SERUM: 3.4 ng/mL (ref 0.0–4.0)

## 2017-04-13 ENCOUNTER — Telehealth: Payer: Self-pay

## 2017-04-13 DIAGNOSIS — R972 Elevated prostate specific antigen [PSA]: Secondary | ICD-10-CM

## 2017-04-13 NOTE — Telephone Encounter (Signed)
Nickie Retort, MD  Lestine Box, LPN        Please let patient know PSA normalized. We can cancel prostate biopsy. He should follow-up instead in 6 months with PSA prior.   Spoke with pt in reference to PSA and cancelling prostate bx. Follow up appts made.

## 2017-04-19 ENCOUNTER — Other Ambulatory Visit: Payer: Medicare Other | Admitting: Urology

## 2017-05-04 ENCOUNTER — Ambulatory Visit: Payer: Medicare Other

## 2017-10-09 ENCOUNTER — Other Ambulatory Visit: Payer: Medicare Other

## 2017-10-09 DIAGNOSIS — R972 Elevated prostate specific antigen [PSA]: Secondary | ICD-10-CM

## 2017-10-10 LAB — PSA: Prostate Specific Ag, Serum: 3.9 ng/mL (ref 0.0–4.0)

## 2017-10-11 ENCOUNTER — Ambulatory Visit: Payer: Medicare Other | Admitting: Urology

## 2017-10-11 ENCOUNTER — Encounter: Payer: Self-pay | Admitting: Urology

## 2017-10-11 VITALS — BP 134/77 | HR 77 | Ht 70.0 in | Wt 236.3 lb

## 2017-10-11 DIAGNOSIS — N5201 Erectile dysfunction due to arterial insufficiency: Secondary | ICD-10-CM | POA: Diagnosis not present

## 2017-10-11 DIAGNOSIS — Z87898 Personal history of other specified conditions: Secondary | ICD-10-CM

## 2017-10-11 MED ORDER — VARDENAFIL HCL 20 MG PO TABS: ORAL_TABLET | ORAL | 3 refills | Status: AC

## 2017-10-11 NOTE — Progress Notes (Signed)
10/11/2017 11:07 AM   Christian Cline 1951/02/06 [id number]  Referring provider: Renee Rival, NP [place], [place] [address]  Chief Complaint  Patient presents with  . Elevated PSA    HPI: 67 year old male who saw Dr. Pilar Cline in January 2019 for PSA of 12.02.  He had a benign DRE and was tentatively scheduled for prostate biopsy however his PSA was repeated and returned to baseline at 3.4.  He presents for a six-month follow-up.  He has no bothersome lower urinary tract symptoms.  He does complain of ED and took Levitra over 4-5 years ago with good efficacy.    PSA repeated earlier this week was stable at 3.9.   PMH: Past Medical History:  Diagnosis Date  . Acid reflux   . Arthritis   . COPD (chronic obstructive pulmonary disease) ([place])   . Gout   . Heart murmur   . Hyperlipidemia   . Hypertension   . Sleep apnea     Surgical History: Past Surgical History:  Procedure Laterality Date  . KIDNEY STONE SURGERY    . SHOULDER ARTHROSCOPY      Home Medications:  Allergies as of 10/11/2017   No Known Allergies     Medication List        Accurate as of 10/11/17 11:07 AM. Always use your most recent med list.          allopurinol 300 MG tablet Commonly known as:  ZYLOPRIM Take 1 tablet by mouth daily.   aspirin EC 81 MG tablet Take 4 tablets by mouth daily.   atorvastatin 40 MG tablet Commonly known as:  LIPITOR Take 1 tablet by mouth daily.   azithromycin 500 MG tablet Commonly known as:  ZITHROMAX Take 1 tablet (500 mg total) by mouth daily.   celecoxib 200 MG capsule Commonly known as:  CELEBREX Take 200 mg by mouth 2 (two) times daily.   chlorthalidone 25 MG tablet Commonly known as:  HYGROTON Take by mouth.   COMBIVENT RESPIMAT 20-100 MCG/ACT Aers respimat Generic drug:  Ipratropium-Albuterol Inhale 1 puff into the lungs every 6 (six) hours.   docusate sodium 100 MG capsule Commonly known as:  COLACE Take 100 mg by mouth  daily as needed for mild constipation.   ELIQUIS 5 MG Tabs tablet Generic drug:  apixaban Take 1 tablet by mouth 2 (two) times daily.   fluticasone 50 MCG/ACT nasal spray Commonly known as:  FLONASE INSTILL 2 SPRAYS IN EACH NOSTRIL TWICE DAILY FOR 7 DAYS, THEN 2 SPRAYS ONCE DAILY AS DIRECTED   Fluticasone-Salmeterol 250-50 MCG/DOSE Aepb Commonly known as:  ADVAIR DISKUS Inhale 1 puff into the lungs 2 (two) times daily.   gabapentin 300 MG capsule Commonly known as:  NEURONTIN TAKE 1 CAPSULE BY MOUTH THREE TIMES A DAY   meclizine 25 MG tablet Commonly known as:  ANTIVERT Take 1 tablet by mouth 3 (three) times daily as needed.   predniSONE 10 MG tablet Commonly known as:  DELTASONE 40 mg po daily for 2 days, 20 mg po daily for 2 days, 10 mg po daily for 2 days.   quinapril 40 MG tablet Commonly known as:  ACCUPRIL   ranitidine 150 MG tablet Commonly known as:  ZANTAC Take 150 mg by mouth 2 (two) times daily.   STOOL SOFTENER PLUS PO Take by mouth.   verapamil 360 MG 24 hr capsule Commonly known as:  VERELAN PM Take 1 capsule by mouth daily.  Allergies: No Known Allergies  Family History: Family History  Problem Relation Age of Onset  . Kidney cancer Brother   . Kidney disease Neg Hx   . Prostate cancer Neg Hx     Social History:  reports that he has been smoking cigarettes.  He has been smoking about 1.50 packs per day. He has never used smokeless tobacco. He reports that he does not drink alcohol or use drugs.  ROS: UROLOGY Frequent Urination?: No Hard to postpone urination?: Yes Burning/pain with urination?: No Get up at night to urinate?: Yes Leakage of urine?: No Urine stream starts and stops?: Yes Trouble starting stream?: No Do you have to strain to urinate?: No Blood in urine?: No Urinary tract infection?: No Sexually transmitted disease?: No Injury to kidneys or bladder?: No Painful intercourse?: No Weak stream?: No Erection problems?:  Yes Penile pain?: No  Gastrointestinal Nausea?: No Vomiting?: No Indigestion/heartburn?: No Diarrhea?: No Constipation?: Yes  Constitutional Fever: No Night sweats?: No Weight loss?: No Fatigue?: No  Skin Skin rash/lesions?: No Itching?: No  Eyes Blurred vision?: Yes Double vision?: No  Ears/Nose/Throat Sore throat?: No Sinus problems?: No  Hematologic/Lymphatic Swollen glands?: No Easy bruising?: No  Cardiovascular Leg swelling?: No Chest pain?: No  Respiratory Cough?: No Shortness of breath?: Yes  Endocrine Excessive thirst?: No  Musculoskeletal Back pain?: Yes Joint pain?: No  Neurological Headaches?: No Dizziness?: No  Psychologic Depression?: No Anxiety?: No  Physical Exam: BP 134/77 (BP Location: Left Arm, Patient Position: Sitting, Cuff Size: Large)   Pulse 77   Ht 5\' 10"  (1.778 m)   Wt 236 lb 4.8 oz (107.2 kg)   BMI 33.91 kg/m   Constitutional:  Alert and oriented, No acute distress. HEENT: [place] AT, moist mucus membranes.  Trachea midline, no masses. Cardiovascular: No clubbing, cyanosis, or edema. Respiratory: Normal respiratory effort, no increased work of breathing. GI: Abdomen is soft, nontender, nondistended, no abdominal masses GU: No CVA tenderness Lymph: No cervical or inguinal lymphadenopathy. Skin: No rashes, bruises or suspicious lesions. Neurologic: Grossly intact, no focal deficits, moving all 4 extremities. Psychiatric: Normal mood and affect.   Assessment & Plan:   67 year old male with history of a transiently elevated PSA which has returned to baseline.  Recommend annual PSA/DRE.  He desires to continue being followed here.  He has noted worsening erectile dysfunction and requested an Rx for Levitra.  He was informed of the availability of generic sildenafil which is much less expensive however desired Levitra.   Christian Cline, [place] [address], [place], [place] [address] [phone]

## 2017-10-13 ENCOUNTER — Encounter: Payer: Self-pay | Admitting: Urology

## 2017-10-13 DIAGNOSIS — Z87898 Personal history of other specified conditions: Secondary | ICD-10-CM | POA: Insufficient documentation

## 2017-10-13 DIAGNOSIS — N5201 Erectile dysfunction due to arterial insufficiency: Secondary | ICD-10-CM | POA: Insufficient documentation

## 2018-03-04 ENCOUNTER — Emergency Department (HOSPITAL_COMMUNITY)
Admission: EM | Admit: 2018-03-04 | Discharge: 2018-03-05 | Disposition: A | Discharge status: Home / Self Care | Payer: Medicare Other | Attending: Emergency Medicine | Admitting: Emergency Medicine

## 2018-03-04 ENCOUNTER — Other Ambulatory Visit: Payer: Self-pay

## 2018-03-04 ENCOUNTER — Emergency Department (HOSPITAL_COMMUNITY): Payer: Medicare Other

## 2018-03-04 ENCOUNTER — Encounter (HOSPITAL_COMMUNITY): Payer: Self-pay | Admitting: Emergency Medicine

## 2018-03-04 DIAGNOSIS — I4891 Unspecified atrial fibrillation: Secondary | ICD-10-CM | POA: Diagnosis not present

## 2018-03-04 DIAGNOSIS — N390 Urinary tract infection, site not specified: Secondary | ICD-10-CM | POA: Insufficient documentation

## 2018-03-04 DIAGNOSIS — R5381 Other malaise: Secondary | ICD-10-CM | POA: Diagnosis present

## 2018-03-04 DIAGNOSIS — R319 Hematuria, unspecified: Secondary | ICD-10-CM | POA: Insufficient documentation

## 2018-03-04 DIAGNOSIS — M7918 Myalgia, other site: Secondary | ICD-10-CM | POA: Diagnosis not present

## 2018-03-04 DIAGNOSIS — R05 Cough: Secondary | ICD-10-CM | POA: Diagnosis not present

## 2018-03-04 DIAGNOSIS — J449 Chronic obstructive pulmonary disease, unspecified: Secondary | ICD-10-CM | POA: Diagnosis not present

## 2018-03-04 DIAGNOSIS — F17210 Nicotine dependence, cigarettes, uncomplicated: Secondary | ICD-10-CM | POA: Diagnosis not present

## 2018-03-04 DIAGNOSIS — Z7982 Long term (current) use of aspirin: Secondary | ICD-10-CM | POA: Insufficient documentation

## 2018-03-04 DIAGNOSIS — Z7901 Long term (current) use of anticoagulants: Secondary | ICD-10-CM | POA: Diagnosis not present

## 2018-03-04 DIAGNOSIS — Z79899 Other long term (current) drug therapy: Secondary | ICD-10-CM | POA: Diagnosis not present

## 2018-03-04 DIAGNOSIS — I10 Essential (primary) hypertension: Secondary | ICD-10-CM | POA: Insufficient documentation

## 2018-03-04 LAB — I-STAT TROPONIN, ED: Troponin i, poc: 0 ng/mL (ref 0.00–0.08)

## 2018-03-04 MED ORDER — SODIUM CHLORIDE 0.9 % IV BOLUS
1000.0000 mL | Freq: Once | INTRAVENOUS | Status: AC
  Administered 2018-03-05: 1000 mL via INTRAVENOUS

## 2018-03-04 NOTE — ED Triage Notes (Addendum)
Patient from home, c/o back pain and generalized body aches since Thursday. States he had chills and a cough with gray sputum. Has not had flu shot yet. Also complaining of dry mouth. No urinary complaints. Patient aox4, VSS.

## 2018-03-04 NOTE — ED Provider Notes (Signed)
[facility] EMERGENCY DEPARTMENT Provider Note   CSN: [id number] Arrival date & time: 03/04/18  2248     History   Chief Complaint Chief Complaint  Patient presents with  . Back Pain    HPI Christian Cline is a 67 y.o. male.  Patient is a 67 year old male with past medical history of COPD, hypertension, high cholesterol, paroxysmal A. fib.  He presents today with a one-week history of "not feeling well".  He reports generalized malaise, cough, decreased appetite, and muscle aches.  He denies any definite fevers, but has felt chilled.  He denies any ill contacts.  There are no aggravating or alleviating factors.  He denies any chest pain, abdominal pain, or shortness of breath.  The history is provided by the patient.    Past Medical History:  Diagnosis Date  . Acid reflux   . Arthritis   . COPD (chronic obstructive pulmonary disease) ([place])   . Gout   . Heart murmur   . Hyperlipidemia   . Hypertension   . Sleep apnea     Patient Active Problem List   Diagnosis Date Noted  . History of elevated PSA 10/13/2017  . Erectile dysfunction due to arterial insufficiency 10/13/2017  . Acute respiratory failure ([place]) 11/11/2016  . Essential hypertension 04/13/2015  . Obstructive sleep apnea 06/10/2014  . Paroxysmal A-fib ([place]) 06/10/2014    Past Surgical History:  Procedure Laterality Date  . KIDNEY STONE SURGERY    . SHOULDER ARTHROSCOPY          Home Medications    Prior to Admission medications   Medication Sig Start Date End Date Taking? Authorizing Provider  allopurinol (ZYLOPRIM) 300 MG tablet Take 1 tablet by mouth daily. 09/30/16   [provider]  aspirin EC 81 MG tablet Take 4 tablets by mouth daily. 09/16/15   [provider]  atorvastatin (LIPITOR) 40 MG tablet Take 1 tablet by mouth daily.    [provider]  azithromycin (ZITHROMAX) 500 MG tablet Take 1 tablet (500 mg total) by mouth daily. 11/15/16   Demetrios Loll, MD  Casanthranol-Docusate Sodium (STOOL SOFTENER PLUS PO) Take by mouth.    [provider]  celecoxib (CELEBREX) 200 MG capsule Take 200 mg by mouth 2 (two) times daily.    [provider]  chlorthalidone (HYGROTON) 25 MG tablet Take by mouth.    [provider]  docusate sodium (COLACE) 100 MG capsule Take 100 mg by mouth daily as needed for mild constipation.    [provider]  ELIQUIS 5 MG TABS tablet Take 1 tablet by mouth 2 (two) times daily. 09/04/16   [provider]  fluticasone (FLONASE) 50 MCG/ACT nasal spray INSTILL 2 SPRAYS IN EACH NOSTRIL TWICE DAILY FOR 7 DAYS, THEN 2 SPRAYS ONCE DAILY AS DIRECTED 08/26/17   [provider]  Fluticasone-Salmeterol (ADVAIR DISKUS) 250-50 MCG/DOSE AEPB Inhale 1 puff into the lungs 2 (two) times daily. 11/14/16 11/14/17  Demetrios Loll, MD  gabapentin (NEURONTIN) 300 MG capsule TAKE 1 CAPSULE BY MOUTH THREE TIMES A DAY 03/16/17   [provider]  Ipratropium-Albuterol (COMBIVENT RESPIMAT) 20-100 MCG/ACT AERS respimat Inhale 1 puff into the lungs every 6 (six) hours.    [provider]  meclizine (ANTIVERT) 25 MG tablet Take 1 tablet by mouth 3 (three) times daily as needed.    [provider]  predniSONE (DELTASONE) 10 MG tablet 40 mg po daily for 2 days, 20 mg po daily for 2  days, 10 mg po daily for 2 days. 11/15/16   Demetrios Loll, MD  quinapril (ACCUPRIL) 40 MG tablet  03/22/14   [provider]  ranitidine (ZANTAC) 150 MG tablet Take 150 mg by mouth 2 (two) times daily.    [provider]  vardenafil (LEVITRA) 20 MG tablet 1 tab 60 minutes prior to intercourse 10/11/17   Stoioff, Ronda Fairly, MD  verapamil (VERELAN PM) 360 MG 24 hr capsule Take 1 capsule by mouth daily. 08/10/16   [provider]    Family History Family History  Problem Relation Age of Onset  . Kidney cancer Brother   . Kidney disease Neg Hx   . Prostate cancer Neg Hx     Social  History Social History   Tobacco Use  . Smoking status: Current Every Day Smoker    Packs/day: 1.50    Types: Cigarettes  . Smokeless tobacco: Never Used  Substance Use Topics  . Alcohol use: No    Frequency: Never  . Drug use: No     Allergies   Patient has no known allergies.   Review of Systems Review of Systems  All other systems reviewed and are negative.    Physical Exam Updated Vital Signs BP 135/79   Pulse (!) 101   Temp 99.1 F (37.3 C) (Oral)   Resp 16   Ht 5\' 10"  (1.778 m)   Wt 106.6 kg   SpO2 98%   BMI 33.72 kg/m   Physical Exam Vitals signs and nursing note reviewed.  Constitutional:      General: He is not in acute distress.    Appearance: He is well-developed. He is not diaphoretic.  HENT:     Head: Normocephalic and atraumatic.     Mouth/Throat:     Pharynx: No oropharyngeal exudate or posterior oropharyngeal erythema.  Neck:     Musculoskeletal: Normal range of motion and neck supple. No neck rigidity or muscular tenderness.  Cardiovascular:     Rate and Rhythm: Normal rate and regular rhythm.     Heart sounds: No murmur. No friction rub.  Pulmonary:     Effort: Pulmonary effort is normal. No respiratory distress.     Breath sounds: Normal breath sounds. No wheezing or rales.  Abdominal:     General: Bowel sounds are normal. There is no distension.     Palpations: Abdomen is soft.     Tenderness: There is no abdominal tenderness.  Musculoskeletal: Normal range of motion.  Lymphadenopathy:     Cervical: No cervical adenopathy.  Skin:    General: Skin is warm and dry.  Neurological:     Mental Status: He is alert and oriented to person, place, and time.     Coordination: Coordination normal.      ED Treatments / Results  Labs (all labs ordered are listed, but only abnormal results are displayed) Labs Reviewed  COMPREHENSIVE METABOLIC PANEL  CBC WITH DIFFERENTIAL/PLATELET  URINALYSIS, ROUTINE W REFLEX MICROSCOPIC  I-STAT  TROPONIN, ED    EKG None  Radiology No results found.  Procedures Procedures (including critical care time)  Medications Ordered in ED Medications  sodium chloride 0.9 % bolus 1,000 mL (has no administration in time range)     Initial Impression / Assessment and Plan / ED Course  I have reviewed the triage vital signs and the nursing notes.  Pertinent labs & imaging results that were available during my care of the patient were reviewed by me and considered in my  medical decision making (see chart for details).  Patient is a 67 year old male with past medical history as described in the HPI presenting with complaints of generalized body aches and not feeling well for the past several days.  He had an episode of back pain the other day that he thought may have been a kidney stone.  His work-up today shows no obvious abnormalities.  His renal function is mildly elevated, however consistent with baseline.  White count is slightly elevated, but consistent with prior studies.   His urine does show evidence for UTI, but renal CT shows no obstructing stones.  Clinically he appears well and appears appropriate for discharge.  He will be treated with Cipro for a urinary tract infection and is to follow-up as needed for any problems.  Final Clinical Impressions(s) / ED Diagnoses   Final diagnoses:  None    ED Discharge Orders    None       Veryl Speak, MD 03/05/18 [phone]

## 2018-03-05 ENCOUNTER — Emergency Department (HOSPITAL_COMMUNITY): Payer: Medicare Other

## 2018-03-05 LAB — CBC WITH DIFFERENTIAL/PLATELET
Abs Immature Granulocytes: 0.09 10*3/uL — ABNORMAL HIGH (ref 0.00–0.07)
BASOS ABS: 0 10*3/uL (ref 0.0–0.1)
Basophils Relative: 0 %
Eosinophils Absolute: 0 10*3/uL (ref 0.0–0.5)
Eosinophils Relative: 0 %
HCT: 45.4 % (ref 39.0–52.0)
Hemoglobin: 15.4 g/dL (ref 13.0–17.0)
Immature Granulocytes: 1 %
Lymphocytes Relative: 15 %
Lymphs Abs: 2 10*3/uL (ref 0.7–4.0)
MCH: 32.2 pg (ref 26.0–34.0)
MCHC: 33.9 g/dL (ref 30.0–36.0)
MCV: 94.8 fL (ref 80.0–100.0)
Monocytes Absolute: 1.3 10*3/uL — ABNORMAL HIGH (ref 0.1–1.0)
Monocytes Relative: 10 %
Neutro Abs: 10.2 10*3/uL — ABNORMAL HIGH (ref 1.7–7.7)
Neutrophils Relative %: 74 %
Platelets: 233 10*3/uL (ref 150–400)
RBC: 4.79 MIL/uL (ref 4.22–5.81)
RDW: 13.1 % (ref 11.5–15.5)
WBC: 13.7 10*3/uL — AB (ref 4.0–10.5)
nRBC: 0 % (ref 0.0–0.2)

## 2018-03-05 LAB — URINALYSIS, MICROSCOPIC (REFLEX)

## 2018-03-05 LAB — URINALYSIS, ROUTINE W REFLEX MICROSCOPIC
Bilirubin Urine: NEGATIVE
Glucose, UA: NEGATIVE mg/dL
Ketones, ur: NEGATIVE mg/dL
NITRITE: NEGATIVE
Protein, ur: NEGATIVE mg/dL
Specific Gravity, Urine: 1.02 (ref 1.005–1.030)
pH: 5.5 (ref 5.0–8.0)

## 2018-03-05 LAB — COMPREHENSIVE METABOLIC PANEL
ALT: 17 U/L (ref 0–44)
AST: 16 U/L (ref 15–41)
Albumin: 3.2 g/dL — ABNORMAL LOW (ref 3.5–5.0)
Alkaline Phosphatase: 72 U/L (ref 38–126)
Anion gap: 12 (ref 5–15)
BUN: 23 mg/dL (ref 8–23)
CO2: 26 mmol/L (ref 22–32)
Calcium: 9.2 mg/dL (ref 8.9–10.3)
Chloride: 96 mmol/L — ABNORMAL LOW (ref 98–111)
Creatinine, Ser: 2.42 mg/dL — ABNORMAL HIGH (ref 0.61–1.24)
GFR calc Af Amer: 31 mL/min — ABNORMAL LOW (ref >60)
GFR calc non Af Amer: 26 mL/min — ABNORMAL LOW (ref >60)
Glucose, Bld: 134 mg/dL — ABNORMAL HIGH (ref 70–99)
Potassium: 3.3 mmol/L — ABNORMAL LOW (ref 3.5–5.1)
Sodium: 134 mmol/L — ABNORMAL LOW (ref 135–145)
Total Bilirubin: 0.8 mg/dL (ref 0.3–1.2)
Total Protein: 7.1 g/dL (ref 6.5–8.1)

## 2018-03-05 MED ORDER — CIPROFLOXACIN HCL 500 MG PO TABS: 500.0000 mg | ORAL_TABLET | Freq: Two times a day (BID) | ORAL | 0 refills | Status: DC

## 2018-03-05 MED ORDER — CIPROFLOXACIN HCL 500 MG PO TABS
500.0000 mg | ORAL_TABLET | Freq: Once | ORAL | Status: AC
  Administered 2018-03-05: 500 mg via ORAL
  Filled 2018-03-05: qty 1

## 2018-03-05 NOTE — Discharge Instructions (Addendum)
Cipro as prescribed.  Drink plenty of fluids and get plenty of rest.  Return to the emergency department if you develop worsening pain, high fever, or other new and concerning symptoms.

## 2018-03-05 NOTE — ED Notes (Signed)
Pt discharged from ED; instructions provided and scripts given; Pt encouraged to return to ED if symptoms worsen and to f/u with PCP; Pt verbalized understanding of all instructions 

## 2018-03-13 ENCOUNTER — Encounter (HOSPITAL_COMMUNITY): Payer: Self-pay | Admitting: Emergency Medicine

## 2018-03-13 ENCOUNTER — Emergency Department (HOSPITAL_COMMUNITY): Payer: Medicare Other

## 2018-03-13 ENCOUNTER — Other Ambulatory Visit: Payer: Self-pay

## 2018-03-13 ENCOUNTER — Emergency Department (HOSPITAL_COMMUNITY)
Admission: EM | Admit: 2018-03-13 | Discharge: 2018-03-13 | Discharge status: Against Medical Advice | Payer: Medicare Other | Attending: Emergency Medicine | Admitting: Emergency Medicine

## 2018-03-13 DIAGNOSIS — N130 Hydronephrosis with ureteropelvic junction obstruction: Secondary | ICD-10-CM | POA: Diagnosis not present

## 2018-03-13 DIAGNOSIS — I10 Essential (primary) hypertension: Secondary | ICD-10-CM | POA: Insufficient documentation

## 2018-03-13 DIAGNOSIS — N202 Calculus of kidney with calculus of ureter: Secondary | ICD-10-CM | POA: Insufficient documentation

## 2018-03-13 DIAGNOSIS — Z5320 Procedure and treatment not carried out because of patient's decision for unspecified reasons: Secondary | ICD-10-CM | POA: Diagnosis not present

## 2018-03-13 DIAGNOSIS — J449 Chronic obstructive pulmonary disease, unspecified: Secondary | ICD-10-CM | POA: Diagnosis not present

## 2018-03-13 DIAGNOSIS — N200 Calculus of kidney: Secondary | ICD-10-CM

## 2018-03-13 DIAGNOSIS — R1031 Right lower quadrant pain: Secondary | ICD-10-CM | POA: Diagnosis present

## 2018-03-13 LAB — COMPREHENSIVE METABOLIC PANEL
ALT: 22 U/L (ref 0–44)
AST: 15 U/L (ref 15–41)
Albumin: 3.2 g/dL — ABNORMAL LOW (ref 3.5–5.0)
Alkaline Phosphatase: 68 U/L (ref 38–126)
Anion gap: 11 (ref 5–15)
BUN: 41 mg/dL — ABNORMAL HIGH (ref 8–23)
CHLORIDE: 99 mmol/L (ref 98–111)
CO2: 26 mmol/L (ref 22–32)
Calcium: 9.5 mg/dL (ref 8.9–10.3)
Creatinine, Ser: 3.33 mg/dL — ABNORMAL HIGH (ref 0.61–1.24)
GFR calc Af Amer: 21 mL/min — ABNORMAL LOW (ref >60)
GFR, EST NON AFRICAN AMERICAN: 18 mL/min — AB (ref >60)
Glucose, Bld: 91 mg/dL (ref 70–99)
Potassium: 3.9 mmol/L (ref 3.5–5.1)
Sodium: 136 mmol/L (ref 135–145)
Total Bilirubin: 0.6 mg/dL (ref 0.3–1.2)
Total Protein: 7.6 g/dL (ref 6.5–8.1)

## 2018-03-13 LAB — CBC WITH DIFFERENTIAL/PLATELET
Abs Immature Granulocytes: 0.14 10*3/uL — ABNORMAL HIGH (ref 0.00–0.07)
Basophils Absolute: 0.1 10*3/uL (ref 0.0–0.1)
Basophils Relative: 0 %
Eosinophils Absolute: 0 10*3/uL (ref 0.0–0.5)
Eosinophils Relative: 0 %
HCT: 45.6 % (ref 39.0–52.0)
Hemoglobin: 15.5 g/dL (ref 13.0–17.0)
Immature Granulocytes: 1 %
Lymphocytes Relative: 15 %
Lymphs Abs: 2.5 10*3/uL (ref 0.7–4.0)
MCH: 32.4 pg (ref 26.0–34.0)
MCHC: 34 g/dL (ref 30.0–36.0)
MCV: 95.2 fL (ref 80.0–100.0)
Monocytes Absolute: 1.2 10*3/uL — ABNORMAL HIGH (ref 0.1–1.0)
Monocytes Relative: 7 %
Neutro Abs: 13.2 10*3/uL — ABNORMAL HIGH (ref 1.7–7.7)
Neutrophils Relative %: 77 %
Platelets: 418 10*3/uL — ABNORMAL HIGH (ref 150–400)
RBC: 4.79 MIL/uL (ref 4.22–5.81)
RDW: 12.7 % (ref 11.5–15.5)
WBC: 17.1 10*3/uL — AB (ref 4.0–10.5)
nRBC: 0 % (ref 0.0–0.2)

## 2018-03-13 LAB — URINALYSIS, ROUTINE W REFLEX MICROSCOPIC
Bacteria, UA: NONE SEEN
Bilirubin Urine: NEGATIVE
Glucose, UA: NEGATIVE mg/dL
Ketones, ur: NEGATIVE mg/dL
Nitrite: NEGATIVE
PROTEIN: NEGATIVE mg/dL
Specific Gravity, Urine: 1.011 (ref 1.005–1.030)
pH: 5 (ref 5.0–8.0)

## 2018-03-13 MED ORDER — ONDANSETRON HCL 4 MG/2ML IJ SOLN
4.0000 mg | Freq: Once | INTRAMUSCULAR | Status: DC
  Filled 2018-03-13: qty 2

## 2018-03-13 MED ORDER — HYDROMORPHONE HCL 1 MG/ML IJ SOLN
0.5000 mg | Freq: Once | INTRAMUSCULAR | Status: DC
  Filled 2018-03-13: qty 1

## 2018-03-13 NOTE — ED Provider Notes (Signed)
[facility] EMERGENCY DEPARTMENT Provider Note   CSN: [id number] Arrival date & time: 03/13/18  1249     History   Chief Complaint Chief Complaint  Patient presents with  . Abdominal Pain  . Flank Pain  . Nephrolithiasis    HPI KINAN SAFLEY is a 67 y.o. male possible history of arthritis, COPD, gout, hyperlipidemia, hypertension, nonobstructive kidney stones who presents for evaluation of right flank pain that began about 3 days ago.  Patient states that he was seen here on 03/04/18 for evaluation of urea.  At that time, he was found to have a UTI and was discharged home on antibiotics which he states he has completed.  Patient reports that about 3 days ago, he started developing some right flank pain that radiated to the right abdomen.  He has had some associated nausea but denies any vomiting.  He states he has not noticed any hematuria but does have some uncomfortable burning with urination.  Patient states that he has a history of nonobstructing kidney stones and sees Dr. Bernardo Heater for urology in [place].  He states that he attempted to call his urologist to get some pain medication for his pain but states that he is out of the office.  He states he has an appointment with him at 8 AM on Friday.  Patient states that he comes in today because he is continuing to have pain and does not have anything that he can take for pain medication.  He reports at home, he took ibuprofen with no improvement in symptoms.  Patient states that he attempted to get pain medication from his urologist but since his urologist is out of the office was unable to get any.  Patient states he has not had any fevers, vomiting, chest pain, difficulty breathing.  HPI  Past Medical History:  Diagnosis Date  . Acid reflux   . Arthritis   . COPD (chronic obstructive pulmonary disease) ([place])   . Gout   . Heart murmur   . Hyperlipidemia   . Hypertension   . Sleep apnea     Patient Active  Problem List   Diagnosis Date Noted  . History of elevated PSA 10/13/2017  . Erectile dysfunction due to arterial insufficiency 10/13/2017  . Acute respiratory failure ([place]) 11/11/2016  . Essential hypertension 04/13/2015  . Obstructive sleep apnea 06/10/2014  . Paroxysmal A-fib ([place]) 06/10/2014    Past Surgical History:  Procedure Laterality Date  . KIDNEY STONE SURGERY    . SHOULDER ARTHROSCOPY          Home Medications    Prior to Admission medications   Medication Sig Start Date End Date Taking? Authorizing Provider  allopurinol (ZYLOPRIM) 300 MG tablet Take 1 tablet by mouth daily. 09/30/16   [provider]  aspirin EC 81 MG tablet Take 4 tablets by mouth daily. 09/16/15   [provider]  atorvastatin (LIPITOR) 40 MG tablet Take 1 tablet by mouth daily.    [provider]  azithromycin (ZITHROMAX) 500 MG tablet Take 1 tablet (500 mg total) by mouth daily. 11/15/16   Demetrios Loll, MD  Casanthranol-Docusate Sodium (STOOL SOFTENER PLUS PO) Take by mouth.    [provider]  celecoxib (CELEBREX) 200 MG capsule Take 200 mg by mouth 2 (two) times daily.    [provider]  chlorthalidone (HYGROTON) 25 MG tablet Take by mouth.    [provider]  ciprofloxacin (CIPRO) 500 MG tablet Take 1 tablet (500 mg  total) by mouth 2 (two) times daily. One po bid x 7 days 03/05/18   Veryl Speak, MD  docusate sodium (COLACE) 100 MG capsule Take 100 mg by mouth daily as needed for mild constipation.    [provider]  ELIQUIS 5 MG TABS tablet Take 1 tablet by mouth 2 (two) times daily. 09/04/16   [provider]  fluticasone (FLONASE) 50 MCG/ACT nasal spray INSTILL 2 SPRAYS IN EACH NOSTRIL TWICE DAILY FOR 7 DAYS, THEN 2 SPRAYS ONCE DAILY AS DIRECTED 08/26/17   [provider]  Fluticasone-Salmeterol (ADVAIR DISKUS) 250-50 MCG/DOSE AEPB Inhale 1 puff into the lungs 2 (two) times daily. 11/14/16 11/14/17  Demetrios Loll, MD    gabapentin (NEURONTIN) 300 MG capsule TAKE 1 CAPSULE BY MOUTH THREE TIMES A DAY 03/16/17   [provider]  Ipratropium-Albuterol (COMBIVENT RESPIMAT) 20-100 MCG/ACT AERS respimat Inhale 1 puff into the lungs every 6 (six) hours.    [provider]  meclizine (ANTIVERT) 25 MG tablet Take 1 tablet by mouth 3 (three) times daily as needed.    [provider]  predniSONE (DELTASONE) 10 MG tablet 40 mg po daily for 2 days, 20 mg po daily for 2 days, 10 mg po daily for 2 days. 11/15/16   Demetrios Loll, MD  quinapril (ACCUPRIL) 40 MG tablet  03/22/14   [provider]  ranitidine (ZANTAC) 150 MG tablet Take 150 mg by mouth 2 (two) times daily.    [provider]  vardenafil (LEVITRA) 20 MG tablet 1 tab 60 minutes prior to intercourse 10/11/17   Stoioff, Ronda Fairly, MD  verapamil (VERELAN PM) 360 MG 24 hr capsule Take 1 capsule by mouth daily. 08/10/16   [provider]    Family History Family History  Problem Relation Age of Onset  . Kidney cancer Brother   . Kidney disease Neg Hx   . Prostate cancer Neg Hx     Social History Social History   Tobacco Use  . Smoking status: Current Every Day Smoker    Packs/day: 1.50    Types: Cigarettes  . Smokeless tobacco: Never Used  Substance Use Topics  . Alcohol use: No    Frequency: Never  . Drug use: No     Allergies   Patient has no known allergies.   Review of Systems Review of Systems  Constitutional: Negative for fever.  Respiratory: Negative for cough and shortness of breath.   Cardiovascular: Negative for chest pain.  Gastrointestinal: Positive for nausea. Negative for abdominal pain and vomiting.  Genitourinary: Positive for dysuria and flank pain. Negative for hematuria.  Neurological: Negative for headaches.  All other systems reviewed and are negative.    Physical Exam Updated Vital Signs BP 129/79   Pulse (!) 42   Temp (!) 97.5 F (36.4 C) (Oral)   Resp 18   SpO2 98%    Physical Exam Vitals signs and nursing note reviewed.  Constitutional:      Appearance: Normal appearance. He is well-developed.     Comments: Appears uncomfortable NAD  HENT:     Head: Normocephalic and atraumatic.  Eyes:     General: Lids are normal.     Conjunctiva/sclera: Conjunctivae normal.     Pupils: Pupils are equal, round, and reactive to light.  Neck:     Musculoskeletal: Full passive range of motion without pain.  Cardiovascular:     Rate and Rhythm: Normal rate and regular rhythm.     Pulses: Normal pulses.  Radial pulses are 2+ on the right side and 2+ on the left side.     Heart sounds: Normal heart sounds. No murmur. No friction rub. No gallop.   Pulmonary:     Effort: Pulmonary effort is normal.     Breath sounds: Normal breath sounds.     Comments: Lungs clear to auscultation bilaterally.  Symmetric chest rise.  No wheezing, rales, rhonchi. Abdominal:     Palpations: Abdomen is soft. Abdomen is not rigid.     Tenderness: There is abdominal tenderness in the right lower quadrant. There is right CVA tenderness. There is no left CVA tenderness or guarding.     Comments: Abdomen is soft, nondistended.  He does have tenderness noted to the right lower quadrant of the abdomen.  Additionally, he has right-sided CVA tenderness.  No rigidity, guarding.  Musculoskeletal: Normal range of motion.  Skin:    General: Skin is warm and dry.     Capillary Refill: Capillary refill takes less than 2 seconds.  Neurological:     Mental Status: He is alert and oriented to person, place, and time.  Psychiatric:        Speech: Speech normal.      ED Treatments / Results  Labs (all labs ordered are listed, but only abnormal results are displayed) Labs Reviewed  URINALYSIS, ROUTINE W REFLEX MICROSCOPIC - Abnormal; Notable for the following components:      Result Value   Color, Urine STRAW (*)    Hgb urine dipstick SMALL (*)    Leukocytes, UA TRACE (*)    All other  components within normal limits  COMPREHENSIVE METABOLIC PANEL - Abnormal; Notable for the following components:   BUN 41 (*)    Creatinine, Ser 3.33 (*)    Albumin 3.2 (*)    GFR calc non Af Amer 18 (*)    GFR calc Af Amer 21 (*)    All other components within normal limits  CBC WITH DIFFERENTIAL/PLATELET - Abnormal; Notable for the following components:   WBC 17.1 (*)    Platelets 418 (*)    Neutro Abs 13.2 (*)    Monocytes Absolute 1.2 (*)    Abs Immature Granulocytes 0.14 (*)    All other components within normal limits    EKG None  Radiology Ct Renal Stone Study  Result Date: 03/13/2018 CLINICAL DATA:  67 year old male with history of right-sided flank pain. Recent history of kidney stones. EXAM: CT ABDOMEN AND PELVIS WITHOUT CONTRAST TECHNIQUE: Multidetector CT imaging of the abdomen and pelvis was performed following the standard protocol without IV contrast. COMPARISON:  CT the abdomen and pelvis 03/05/2018. FINDINGS: Lower chest: Unremarkable. Hepatobiliary: No definite suspicious cystic or solid hepatic lesions are confidently identified on today's noncontrast CT examination. Unenhanced appearance of the gallbladder is normal. Pancreas: No definite pancreatic mass or peripancreatic fluid or inflammatory changes are noted on today's noncontrast CT examination. Spleen: Unremarkable. Adrenals/Urinary Tract: Multiple nonobstructive calculi are noted within the collecting systems of both kidneys, largest of which is a 12 mm calculus in the interpolar collecting system of left kidney. In addition, in the right renal pelvis at the level of the ureteropelvic junction there is a 13 mm calculus. This is associated with mild proximal hydronephrosis. No additional calculi along the course of either ureter or within the lumen of the urinary bladder. Low-attenuation lesions in both kidneys are noted, incompletely characterized on today's noncontrast CT examination, but similar to the prior study  and statistically likely to  represent cysts, largest of which is exophytic in the lower pole of the right kidney measuring 6.7 cm in diameter. Unenhanced appearance of the urinary bladder is normal. Bilateral adrenal glands are normal in appearance. Stomach/Bowel: Stomach is normal in appearance. No pathologic dilatation of small bowel or colon. Numerous colonic diverticulae are noted, without surrounding inflammatory changes to suggest an acute diverticulitis at this time. Normal appendix. Vascular/Lymphatic: Aortic atherosclerosis. No lymphadenopathy noted in the abdomen or pelvis. Reproductive: Prostate gland and seminal vesicles are unremarkable in appearance. Other: No significant volume of ascites.  No pneumoperitoneum. Musculoskeletal: There are no aggressive appearing lytic or blastic lesions noted in the visualized portions of the skeleton. IMPRESSION: 1. 13 mm calculus at the right ureterovesicular junction with mild hydroureteronephrosis. 2. Multiple additional nonobstructive calculi are noted within the collecting systems of both kidneys, largest of which measures 12 mm in the interpolar collecting system of left kidney. 3. Colonic diverticulosis without evidence of acute diverticulitis at this time. 4. Aortic atherosclerosis. Electronically Signed   By: Vinnie Langton M.D.   On: 03/13/2018 18:07    Procedures Procedures (including critical care time)  Medications Ordered in ED Medications  ondansetron (ZOFRAN) injection 4 mg (has no administration in time range)     Initial Impression / Assessment and Plan / ED Course  I have reviewed the triage vital signs and the nursing notes.  Pertinent labs & imaging results that were available during my care of the patient were reviewed by me and considered in my medical decision making (see chart for details).     67 year old male who presents for evaluation of right flank pain x3 days.  Seen here on 03/04/18 for evaluation of UTI.  At that  time, CT renal study showed nonobstructing stones about the lower right and left kidney.  He does have urology but states his urologist out of town he has not been able to getting the pain medication.  Report some slight nausea and dysuria.  No fevers, vomiting. Patient is afebrile, non-toxic appearing, sitting comfortably on examination table. Vital signs reviewed and stable.  Concern for obstructing kidney stone versus pyelonephritis.  Plan for basic labs, urine, CT renal study.  CBC shows leukocytosis of 17.1.  He does have a history of leukocytosis but on 03/04/18, his white blood cell count was 13.7 at that time.  CMP shows BUN and creatinine are elevated at 41 and 3.33 respectively.  His lab work from 9 days ago shows BUN was 23 and creatinine was 2.42 at that time.  His baseline creatinine seems to be around 2.  UA shows small hemoglobin.  Trace leukocytes.  Does look improved from previous.  RN inform you that patient had initially declined pain medication because he was worried about driving home by himself.  I discussed with patient and he stated that he did not want pain medication here in ED.  Patient additionally did not want further work-up.  He stated that he just wanted a prescription for some pain medication and wanted to go home and follow-up with his urologist.  I discussed with patient that given new onset of pain, cannot possibly give pain medication without full evaluation of his symptoms.  After extensive discussion with the patient, he was agreeable to stay for CT scan.  CT renal study reviewed.  He has a 13 mm calculus noted at the level of the UPJ of the right kidney.  There is some associated mild proximal hydronephrosis of the right kidney.  Additionally,  he has similar nonobstructing calculi seen in both the right and left kidney.  Given concerns for obstructive kidney stone that is greater than 10 mm, will contact patient's urologist.  I discussed with patient regarding the CT  findings.  I explained to him that I would need to consult his urologist for further recommendation of his symptoms.  At this time, patient became very upset and stated that he did not want to stay.  I discussed at length with patient regarding his work-up and results and possible complications of obstructing kidney stone of that size.  I discussed with him that urological consult was needed for further evaluation and potentially he would need for further intervention.  Patient again became upset and requested pain medication so he could just leave.  I discussed with him that I would not discharge him until I have consulted urology. I explained to patient that given concerns of kidney stone and worsening BUN/Cr, I would need to consult urology for further management.   At that time, patient stated that he was not going to stay anymore.  I discussed with patient that if he left at this time without contacting urology and before completion of treatment, he would be leaving [facility].  I discussed with him the risks versus benefits of leaving including but not limited to worse condition, death.  Patient expresses full understanding of risk first benefits.  Patient exhibits full medical decision-making capacity.  Patient was discharged [facility].  Portions of this note were generated with [employer]. Dictation errors may occur despite best attempts at proofreading.    Final Clinical Impressions(s) / ED Diagnoses   Final diagnoses:  Kidney stone    ED Discharge Orders    None       Desma Mcgregor 03/13/18 2036    Julianne Rice, MD 03/17/18 [phone]

## 2018-03-13 NOTE — ED Notes (Signed)
Pt left AMA without signing any paperwork, PA Layden at bedside when event occurred and she spoke with pt, pt walked out of ED.

## 2018-03-13 NOTE — ED Notes (Signed)
Patient transported to CT 

## 2018-03-13 NOTE — ED Triage Notes (Signed)
Pt. Stated, I was here the 22 for kidney stones and they are back , my urologist id out of town and I need something for pain.

## 2018-03-15 NOTE — Progress Notes (Signed)
03/16/2018 9:45 AM   Christian Cline 1951/03/05 [id number]  Referring provider: Renee Rival, NP [place], [place] [address]  Chief Complaint  Patient presents with  . ER follow up    HPI: Christian Cline is a 68 y.o. Caucasian male with a history of nephrolithiasis, who presents today for follow-up from the ED for a right UPJ stone.  ED: He presented to the emergency department on 03/03/18 for evaluation of right flank pain that began 03/11/18 and radiated to the right abdomen.  At that time he reported some associated nausea and uncomfortable burning with urination, but denied vomiting and hematuria.  He reported taking ibuprofen with no improvement in symptoms.  He denied at that point any fevers, chest pain, and difficulty breathing.  They had a CT renal stone study ordered during the emergency room visit on 03/13/18.  It found a 82mm calculus at the right ureterovescular junction with mild hydroureteronephrosis as well as multiple additional nonobsctructive calculi noted within the collectin systems of both kidneys.  The largest of those was a 28mm calculus in the interpolar collecting system of the left kidney.  He left AMA because of time.  Labs in the ED: Creatinine, Ser 3.33 WBC 17.1 On the microscopic UA, it showed: 0-5 RBC, 0-5 WBC. UA showed: Small amount of Hgb, trace of leukocytes, Urine was straw colored, otherwise normal.   Meds given in the ED: Ondansetron (injection, 4 mg); no perscriptions  Prior urological history: Previous patient of Dr. Barron Schmid, has had uteroscopy with stent placement & lithotripsy, was on a stone prevention agent but discontinued it, stone composition unknown.   Current NSAID/anticoagulation: Asprin and Eliquis, per patient he stopped it 03/14/2018.   Today:  UTI was diagnosed in [facility] in 03/06/2019 at, he was given cipro which he took for 1 week.  Went to ER again on 03/14/2019 to try to get pain medication.  Patient  has had stents before, as well as several medications, and been put for 10 years by Dr Barron Schmid on a medication that was supposed to prevent/reduce stone formation.    Patient admits to feeling right flank pain today.  Patient denies fevers, chills, nausea, vomiting.  Pain waxes and wanes, at points being unbearable.  Patient has normally had stones in the left kidney.  Patient is aware and anticipating needing to get stents placed, having ceased as of 03/14/2018 any blood thinners, and has not taken any oxycodone since the afternoon of 03/15/2018.  Reviewed referral notes.    PMH: Past Medical History:  Diagnosis Date  . Acid reflux   . Arthritis   . COPD (chronic obstructive pulmonary disease) ([place])   . Gout   . Heart murmur   . Hyperlipidemia   . Hypertension   . Sleep apnea     Surgical History: Past Surgical History:  Procedure Laterality Date  . KIDNEY STONE SURGERY    . SHOULDER ARTHROSCOPY      Home Medications:  Allergies as of 03/16/2018   No Known Allergies     Medication List       Accurate as of March 16, 2018  9:45 AM. Always use your most recent med list.        allopurinol 300 MG tablet Commonly known as:  ZYLOPRIM Take 1 tablet by mouth daily.   aspirin EC 81 MG tablet Take 4 tablets by mouth daily.   atorvastatin 40 MG tablet Commonly known as:  LIPITOR Take 1 tablet by  mouth daily.   celecoxib 200 MG capsule Commonly known as:  CELEBREX Take 200 mg by mouth 2 (two) times daily.   chlorthalidone 25 MG tablet Commonly known as:  HYGROTON Take by mouth.   ciprofloxacin 500 MG tablet Commonly known as:  CIPRO Take 1 tablet (500 mg total) by mouth 2 (two) times daily. One po bid x 7 days   COMBIVENT RESPIMAT 20-100 MCG/ACT Aers respimat Generic drug:  Ipratropium-Albuterol Inhale 1 puff into the lungs every 6 (six) hours.   docusate sodium 100 MG capsule Commonly known as:  COLACE Take 100 mg by mouth daily as needed for mild  constipation.   ELIQUIS 5 MG Tabs tablet Generic drug:  apixaban Take 1 tablet by mouth 2 (two) times daily.   famotidine 20 MG tablet Commonly known as:  PEPCID Take 20 mg by mouth 2 (two) times daily.   fluticasone 50 MCG/ACT nasal spray Commonly known as:  FLONASE INSTILL 2 SPRAYS IN EACH NOSTRIL TWICE DAILY FOR 7 DAYS, THEN 2 SPRAYS ONCE DAILY AS DIRECTED   Fluticasone-Salmeterol 250-50 MCG/DOSE Aepb Commonly known as:  ADVAIR DISKUS Inhale 1 puff into the lungs 2 (two) times daily.   gabapentin 300 MG capsule Commonly known as:  NEURONTIN TAKE 1 CAPSULE BY MOUTH THREE TIMES A DAY   HYDROmorphone 2 MG tablet Commonly known as:  DILAUDID Take 1 tablet (2 mg total) by mouth every 6 (six) hours as needed for severe pain.   meclizine 25 MG tablet Commonly known as:  ANTIVERT Take 1 tablet by mouth 3 (three) times daily as needed.   ondansetron 4 MG disintegrating tablet Commonly known as:  ZOFRAN ODT Take 1 tablet (4 mg total) by mouth every 8 (eight) hours as needed for nausea or vomiting.   quinapril 40 MG tablet Commonly known as:  ACCUPRIL   STOOL SOFTENER PLUS PO Take by mouth.   tamsulosin 0.4 MG Caps capsule Commonly known as:  FLOMAX Take 1 capsule (0.4 mg total) by mouth daily.   vardenafil 20 MG tablet Commonly known as:  LEVITRA 1 tab 60 minutes prior to intercourse   verapamil 360 MG 24 hr capsule Commonly known as:  VERELAN PM Take 1 capsule by mouth daily.       Allergies: No Known Allergies  Family History: Family History  Problem Relation Age of Onset  . Kidney cancer Brother   . Kidney disease Neg Hx   . Prostate cancer Neg Hx     Social History:  reports that he has been smoking cigarettes. He has been smoking about 1.50 packs per day. He has never used smokeless tobacco. He reports that he does not drink alcohol or use drugs.  ROS: UROLOGY Frequent Urination?: Yes Hard to postpone urination?: No Burning/pain with urination?:  No Get up at night to urinate?: Yes Leakage of urine?: No Urine stream starts and stops?: Yes Trouble starting stream?: No Do you have to strain to urinate?: No Blood in urine?: No Urinary tract infection?: No Sexually transmitted disease?: No Injury to kidneys or bladder?: No Painful intercourse?: No Weak stream?: Yes Erection problems?: Yes Penile pain?: No  Gastrointestinal Nausea?: No Vomiting?: No Indigestion/heartburn?: Yes Diarrhea?: No Constipation?: Yes  Constitutional Fever: No Night sweats?: No Weight loss?: No Fatigue?: No  Skin Skin rash/lesions?: No Itching?: No  Eyes Blurred vision?: No Double vision?: No  Ears/Nose/Throat Sore throat?: No Sinus problems?: No  Hematologic/Lymphatic Swollen glands?: No Easy bruising?: No  Cardiovascular Leg swelling?: No Chest pain?:  No  Respiratory Cough?: No Shortness of breath?: Yes  Endocrine Excessive thirst?: No  Musculoskeletal Back pain?: Yes Joint pain?: No  Neurological Headaches?: No Dizziness?: No  Psychologic Depression?: No Anxiety?: No  Physical Exam: BP 117/76 (BP Location: Left Arm, Patient Position: Sitting)   Pulse (!) 111   Temp 98.2 F (36.8 C) (Oral)   Ht 5\' 10"  (1.778 m)   Wt 225 lb 9.6 oz (102.3 kg)   BMI 32.37 kg/m   Constitutional:  Well nourished. Alert and oriented, No acute distress. HEENT: [place] AT, moist mucus membranes.  Trachea midline, no masses. Cardiovascular: No clubbing, cyanosis, or edema. Respiratory: Normal respiratory effort, no increased work of breathing. GI: Abdomen is soft, non tender, non distended, no abdominal masses. Liver and spleen not palpable.  No hernias appreciated.  Stool sample for occult testing is not indicated.   GU: Right CVA tenderness. Skin: No rashes, bruises or suspicious lesions. Neurologic: Grossly intact, no focal deficits, moving all 4 extremities. Psychiatric: Normal mood and affect.  Laboratory Data: Lab Results   Component Value Date   WBC 17.1 (H) 03/13/2018   HGB 15.5 03/13/2018   HCT 45.6 03/13/2018   MCV 95.2 03/13/2018   PLT 418 (H) 03/13/2018    Lab Results  Component Value Date   CREATININE 3.33 (H) 03/13/2018    No results found for: PSA  No results found for: TESTOSTERONE  Lab Results  Component Value Date   HGBA1C 6.0 (H) 11/13/2016    No results found for: TSH  No results found for: CHOL, HDL, CHOLHDL, VLDL, LDLCALC  Lab Results  Component Value Date   AST 15 03/13/2018   Lab Results  Component Value Date   ALT 22 03/13/2018   No components found for: ALKALINEPHOPHATASE No components found for: BILIRUBINTOTAL  No results found for: ESTRADIOL  Urinalysis, 03/13/2018 On the microscopic UA, it showed: 0-5 RBC, 0-5 WBC.    Component Value Date/Time   COLORURINE STRAW (A) 03/13/2018 1306   APPEARANCEUR CLEAR 03/13/2018 1306   LABSPEC 1.011 03/13/2018 1306   PHURINE 5.0 03/13/2018 1306   GLUCOSEU NEGATIVE 03/13/2018 1306   HGBUR SMALL (A) 03/13/2018 1306   BILIRUBINUR NEGATIVE 03/13/2018 1306   KETONESUR NEGATIVE 03/13/2018 1306   PROTEINUR NEGATIVE 03/13/2018 1306   NITRITE NEGATIVE 03/13/2018 1306   LEUKOCYTESUR TRACE (A) 03/13/2018 1306    I have reviewed the labs.   Pertinent Imaging:  03/05/2018 CT Renal Stone Study IMPRESSION: 1. Multiple bilateral nonobstructing nephrolithiasis measuring to 1 cm on the RIGHT. Progressed LEFT lower pole scarring. 2. Mild colonic diverticulosis without acute diverticulitis nor acute intra-abdominal/pelvic process.  03/13/2018 CT Renal Stone Study IMPRESSION: 1. 13 mm calculus at the right ureterovesicular junction with mild hydroureteronephrosis. 2. Multiple additional nonobstructive calculi are noted within the collecting systems of both kidneys, largest of which measures 12 mm in the interpolar collecting system of left kidney. 3. Colonic diverticulosis without evidence of acute diverticulitis at this  time. 4. Aortic atherosclerosis.  I have independently reviewed the films with Dr. Bernardo Heater and we noted the 13 mm stone in the right UPJ.    Assessment & Plan:  1. Right UPJ stone  - schedule right ureteroscopy with laser lithotripsy and ureteral stent placement  - explained to the patient how the procedure is performed and the risks involved  - informed patient that they will have a stent placed during the procedure and will remain in place after the procedure for a short time.   -  stent may be removed in the office with a cystoscope or patient may be instructed to remove the stent themselves by the string  - described "stent pain" as feelings of needing to urinate/overactive bladder and a warm, tingling sensation to intense pain in the affected flank  - residual stones within the kidney or ureter may be present after the procedure and may need to have these addressed at a different encounter  - injury to the ureter is the most common intra-operative risk, it may result in an open procedure to correct the defect  - infection and bleeding are also risks  - explained the risks of general anesthesia, such as: MI, CVA, paralysis, coma and/or death.  - advised to contact our office or seek treatment in the ED if becomes febrile or pain/ vomiting are difficult control in order to arrange for emergent/urgent intervention  - I have given patient dilaudid 2 mg x 10 tablets and a prescription for cipro, flomax, and zofran.  - UA  - Urine culture  2. Right hydronephrosis  - obtain RUS to ensure the hydronephrosis has resolved once they have passed and/or recovered from procedure to ensure to iatrogenic hydronephrosis remains - it is explained to the patient that it is important to document resolution of the hydronephrosis as "silent hydronephrosis" can occur and cause damage and/or loss of the kidney  3. Microscopic hematuria  - UA today demonstrates 0-5 RBCs  - continue to monitor the patient's UA  after the treatment/passage of the stone to ensure the hematuria has resolved  - if hematuria persists, we will pursue a hematuria workup with CT Urogram and cystoscopy if appropriate.}  4. History of nephrolithisis  - Patient also was found to have additional non-obstructive stones on recent CTs  - Will offer 24 hour metabolic workup once recovered from procedure for right UPJ stone   Return for right URS/LL/ureteral stone .  These notes generated with voice recognition software. I apologize for typographical errors.  Joshawa Dubin, PA-C  I, Adele Schilder, am acting as a Education administrator for [employer], PA-C.   I have reviewed the above documentation for accuracy and completeness, and I agree with the above.    Zara Council, PA-C   [facility] [address]  [place], [place] [address] [phone]

## 2018-03-16 ENCOUNTER — Encounter: Payer: Self-pay | Admitting: Urology

## 2018-03-16 ENCOUNTER — Ambulatory Visit: Payer: Medicare Other | Admitting: Urology

## 2018-03-16 ENCOUNTER — Telehealth: Payer: Self-pay | Admitting: Urology

## 2018-03-16 VITALS — BP 117/76 | HR 111 | Temp 98.2°F | Ht 70.0 in | Wt 225.6 lb

## 2018-03-16 DIAGNOSIS — Z87442 Personal history of urinary calculi: Secondary | ICD-10-CM | POA: Diagnosis not present

## 2018-03-16 DIAGNOSIS — R3129 Other microscopic hematuria: Secondary | ICD-10-CM

## 2018-03-16 DIAGNOSIS — N132 Hydronephrosis with renal and ureteral calculous obstruction: Secondary | ICD-10-CM | POA: Diagnosis not present

## 2018-03-16 DIAGNOSIS — N201 Calculus of ureter: Secondary | ICD-10-CM

## 2018-03-16 LAB — MICROSCOPIC EXAMINATION: Bacteria, UA: NONE SEEN

## 2018-03-16 LAB — URINALYSIS, COMPLETE
Bilirubin, UA: NEGATIVE
GLUCOSE, UA: NEGATIVE
Ketones, UA: NEGATIVE
Nitrite, UA: NEGATIVE
PROTEIN UA: NEGATIVE
SPEC GRAV UA: 1.02 (ref 1.005–1.030)
Urobilinogen, Ur: 0.2 mg/dL (ref 0.2–1.0)
pH, UA: 5.5 (ref 5.0–7.5)

## 2018-03-16 MED ORDER — HYDROMORPHONE HCL 2 MG PO TABS: 2.0000 mg | ORAL_TABLET | Freq: Four times a day (QID) | ORAL | 0 refills | Status: DC | PRN

## 2018-03-16 MED ORDER — ONDANSETRON 4 MG PO TBDP: 4.0000 mg | ORAL_TABLET | Freq: Three times a day (TID) | ORAL | 0 refills | Status: DC | PRN

## 2018-03-16 MED ORDER — TAMSULOSIN HCL 0.4 MG PO CAPS: 0.4000 mg | ORAL_CAPSULE | Freq: Every day | ORAL | 0 refills | Status: DC

## 2018-03-16 MED ORDER — CIPROFLOXACIN HCL 500 MG PO TABS: 500.0000 mg | ORAL_TABLET | Freq: Two times a day (BID) | ORAL | 0 refills | Status: DC

## 2018-03-16 NOTE — Telephone Encounter (Signed)
Left message advising patient that his cardiologist Dr Ubaldo Glassing recommends stopping Eliquis 3 days prior to surgery.

## 2018-03-18 LAB — CULTURE, URINE COMPREHENSIVE

## 2018-03-21 ENCOUNTER — Other Ambulatory Visit: Payer: Self-pay | Admitting: Radiology

## 2018-03-21 DIAGNOSIS — N201 Calculus of ureter: Secondary | ICD-10-CM

## 2018-03-22 ENCOUNTER — Other Ambulatory Visit: Payer: Self-pay

## 2018-03-22 ENCOUNTER — Encounter
Admission: RE | Admit: 2018-03-22 | Discharge: 2018-03-22 | Disposition: A | Discharge status: Home / Self Care | Payer: Medicare Other | Source: Ambulatory Visit | Attending: Urology | Admitting: Urology

## 2018-03-22 MED ORDER — CEFAZOLIN SODIUM-DEXTROSE 2-4 GM/100ML-% IV SOLN
2.0000 g | INTRAVENOUS | Status: AC
  Administered 2018-03-23: 2 g via INTRAVENOUS

## 2018-03-22 NOTE — Patient Instructions (Signed)
Your procedure is scheduled on: Friday 03/23/2018  Report to [place] at 11:00am   Remember: Instructions that are not followed completely may result in serious medical risk, up to and including death, or upon the discretion of your surgeon and anesthesiologist your surgery may need to be rescheduled.      _X__ 1. Do not eat food after midnight the night before your procedure.                 No gum chewing or hard candies. You may drink clear liquids up to 2 hours                 before you are scheduled to arrive for your surgery- DO NOT drink clear                 liquids within 2 hours of the start of your surgery.                 Clear Liquids include:  water, apple juice without pulp, clear carbohydrate                 drink such as Clearfast or Gatorade, Black Coffee or Tea (Do not add                 anything to coffee or tea).  __X__2.  On the morning of surgery brush your teeth with toothpaste and water, you may rinse your mouth with mouthwash if you wish.  Do not swallow any toothpaste or mouthwash.     _X__ 3.  No Alcohol for 24 hours before or after surgery.   _X__ 4.  Do Not Smoke or use e-cigarettes For 24 Hours Prior to Your Surgery.                 Do not use any chewable tobacco products for at least 6 hours prior to                 surgery.   __X__5.  Notify your doctor if there is any change in your medical condition      (cold, fever, infections).     Do not wear jewelry, make-up, hairpins, clips or nail polish. Do not wear lotions, powders, or perfumes.  Do not shave 48 hours prior to surgery. Men may shave face and neck. Do not bring valuables to the hospital.    [facility] is not responsible for any belongings or valuables.  Contacts, dentures/partials or body piercings may not be worn into surgery. Bring a case for your contacts, glasses or hearing aids, a denture cup will be supplied.    Patients  discharged the day of surgery will not be allowed to drive home.    Please read over the following fact sheets that you were given:   MRSA Information   __X__ Take these medicines the morning of surgery with A SIP OF WATER:     1. allopurinol (ZYLOPRIM) 300 MG tablet  2. atorvastatin (LIPITOR) 40 MG tablet  3. famotidine (PEPCID) 20 MG tablet  4. Fluticasone-Salmeterol (ADVAIR DISKUS) 250-50 MCG/DOSE AEPB  5. tamsulosin (FLOMAX) 0.4 MG CAPS capsule  6. Ipratropium-Albuterol (COMBIVENT RESPIMAT) 20-100 MCG/ACT AERS respimat if needed  7. HYDROmorphone (DILAUDID) 2 MG tablet if needed    __X__ Use CHG Soap/SAGE wipes as directed   _ X___ Use inhalers on the day of surgery. Also bring the inhaler with you to the  hospital on the morning of surgery.    __X__ Stop Anti-inflammatories 7 days before surgery such as Advil, Ibuprofen, Motrin, BC or Goodies Powder, Naprosyn, Naproxen, Aleve, Aspirin, Meloxicam. May take Tylenol if needed for pain or discomfort.

## 2018-03-23 ENCOUNTER — Encounter
Admission: RE | Disposition: A | Discharge status: Home / Self Care | Payer: Self-pay | Source: Home / Self Care | Attending: Urology

## 2018-03-23 ENCOUNTER — Other Ambulatory Visit: Payer: Self-pay

## 2018-03-23 ENCOUNTER — Ambulatory Visit
Admission: RE | Admit: 2018-03-23 | Discharge: 2018-03-23 | Disposition: A | Discharge status: Home / Self Care | Payer: Medicare Other | Attending: Urology | Admitting: Urology

## 2018-03-23 ENCOUNTER — Ambulatory Visit: Payer: Medicare Other | Admitting: Certified Registered"

## 2018-03-23 ENCOUNTER — Encounter: Payer: Self-pay | Admitting: *Deleted

## 2018-03-23 DIAGNOSIS — M1990 Unspecified osteoarthritis, unspecified site: Secondary | ICD-10-CM | POA: Diagnosis not present

## 2018-03-23 DIAGNOSIS — E785 Hyperlipidemia, unspecified: Secondary | ICD-10-CM | POA: Insufficient documentation

## 2018-03-23 DIAGNOSIS — G4730 Sleep apnea, unspecified: Secondary | ICD-10-CM | POA: Diagnosis not present

## 2018-03-23 DIAGNOSIS — M109 Gout, unspecified: Secondary | ICD-10-CM | POA: Diagnosis not present

## 2018-03-23 DIAGNOSIS — F17200 Nicotine dependence, unspecified, uncomplicated: Secondary | ICD-10-CM | POA: Diagnosis not present

## 2018-03-23 DIAGNOSIS — Z87442 Personal history of urinary calculi: Secondary | ICD-10-CM | POA: Diagnosis not present

## 2018-03-23 DIAGNOSIS — E669 Obesity, unspecified: Secondary | ICD-10-CM | POA: Diagnosis not present

## 2018-03-23 DIAGNOSIS — J449 Chronic obstructive pulmonary disease, unspecified: Secondary | ICD-10-CM | POA: Diagnosis not present

## 2018-03-23 DIAGNOSIS — N201 Calculus of ureter: Secondary | ICD-10-CM

## 2018-03-23 DIAGNOSIS — R011 Cardiac murmur, unspecified: Secondary | ICD-10-CM | POA: Diagnosis not present

## 2018-03-23 DIAGNOSIS — N132 Hydronephrosis with renal and ureteral calculous obstruction: Secondary | ICD-10-CM | POA: Insufficient documentation

## 2018-03-23 DIAGNOSIS — K219 Gastro-esophageal reflux disease without esophagitis: Secondary | ICD-10-CM | POA: Insufficient documentation

## 2018-03-23 DIAGNOSIS — I10 Essential (primary) hypertension: Secondary | ICD-10-CM | POA: Diagnosis not present

## 2018-03-23 HISTORY — PX: CYSTOSCOPY W/ RETROGRADES: SHX1426

## 2018-03-23 HISTORY — PX: CYSTOSCOPY WITH STENT PLACEMENT: SHX5790

## 2018-03-23 HISTORY — DX: Personal history of urinary calculi: Z87.442

## 2018-03-23 SURGERY — CYSTOSCOPY, WITH RETROGRADE PYELOGRAM
Anesthesia: General | Site: Ureter | Laterality: Right

## 2018-03-23 MED ORDER — SUGAMMADEX SODIUM 500 MG/5ML IV SOLN
INTRAVENOUS | Status: DC | PRN
  Administered 2018-03-23: 203 mg via INTRAVENOUS

## 2018-03-23 MED ORDER — ONDANSETRON HCL 4 MG/2ML IJ SOLN
INTRAMUSCULAR | Status: DC | PRN
  Administered 2018-03-23: 4 mg via INTRAVENOUS

## 2018-03-23 MED ORDER — FENTANYL CITRATE (PF) 100 MCG/2ML IJ SOLN
INTRAMUSCULAR | Status: AC
  Filled 2018-03-23: qty 2

## 2018-03-23 MED ORDER — SENNOSIDES-DOCUSATE SODIUM 8.6-50 MG PO TABS: 2.0000 | ORAL_TABLET | Freq: Every day | ORAL | 1 refills | Status: AC

## 2018-03-23 MED ORDER — LACTATED RINGERS IV SOLN
INTRAVENOUS | Status: DC
  Administered 2018-03-23: 10:00:00 via INTRAVENOUS

## 2018-03-23 MED ORDER — LIDOCAINE HCL (CARDIAC) PF 100 MG/5ML IV SOSY
PREFILLED_SYRINGE | INTRAVENOUS | Status: DC | PRN
  Administered 2018-03-23: 100 mg via INTRAVENOUS

## 2018-03-23 MED ORDER — OXYCODONE-ACETAMINOPHEN 5-325 MG PO TABS: 1.0000 | ORAL_TABLET | ORAL | Status: DC | PRN

## 2018-03-23 MED ORDER — PROPOFOL 10 MG/ML IV BOLUS
INTRAVENOUS | Status: DC | PRN
  Administered 2018-03-23: 200 mg via INTRAVENOUS

## 2018-03-23 MED ORDER — MIDAZOLAM HCL 2 MG/2ML IJ SOLN
INTRAMUSCULAR | Status: AC
  Filled 2018-03-23: qty 2

## 2018-03-23 MED ORDER — LIDOCAINE HCL (PF) 2 % IJ SOLN
INTRAMUSCULAR | Status: AC
  Filled 2018-03-23: qty 10

## 2018-03-23 MED ORDER — PROMETHAZINE HCL 25 MG/ML IJ SOLN: 6.2500 mg | INTRAMUSCULAR | Status: DC | PRN

## 2018-03-23 MED ORDER — OXYCODONE HCL 5 MG/5ML PO SOLN: 5.0000 mg | Freq: Once | ORAL | Status: DC | PRN

## 2018-03-23 MED ORDER — FENTANYL CITRATE (PF) 100 MCG/2ML IJ SOLN: 25.0000 ug | INTRAMUSCULAR | Status: DC | PRN

## 2018-03-23 MED ORDER — PHENYLEPHRINE HCL 10 MG/ML IJ SOLN
INTRAMUSCULAR | Status: DC | PRN
  Administered 2018-03-23 (×3): 100 ug via INTRAVENOUS

## 2018-03-23 MED ORDER — PROPOFOL 10 MG/ML IV BOLUS
INTRAVENOUS | Status: AC
  Filled 2018-03-23: qty 20

## 2018-03-23 MED ORDER — OXYCODONE HCL 5 MG PO TABS: 5.0000 mg | ORAL_TABLET | Freq: Four times a day (QID) | ORAL | 0 refills | Status: AC | PRN

## 2018-03-23 MED ORDER — BELLADONNA ALKALOIDS-OPIUM 16.2-60 MG RE SUPP: 1.0000 | Freq: Four times a day (QID) | RECTAL | Status: DC | PRN

## 2018-03-23 MED ORDER — SULFAMETHOXAZOLE-TRIMETHOPRIM 800-160 MG PO TABS: 1.0000 | ORAL_TABLET | Freq: Every day | ORAL | 0 refills | Status: DC

## 2018-03-23 MED ORDER — MEPERIDINE HCL 50 MG/ML IJ SOLN: 6.2500 mg | INTRAMUSCULAR | Status: DC | PRN

## 2018-03-23 MED ORDER — DEXAMETHASONE SODIUM PHOSPHATE 10 MG/ML IJ SOLN
INTRAMUSCULAR | Status: AC
  Filled 2018-03-23: qty 1

## 2018-03-23 MED ORDER — FENTANYL CITRATE (PF) 100 MCG/2ML IJ SOLN
INTRAMUSCULAR | Status: DC | PRN
  Administered 2018-03-23 (×2): 50 ug via INTRAVENOUS

## 2018-03-23 MED ORDER — ONDANSETRON HCL 4 MG/2ML IJ SOLN
INTRAMUSCULAR | Status: AC
  Filled 2018-03-23: qty 2

## 2018-03-23 MED ORDER — IOPAMIDOL (ISOVUE-M 200) INJECTION 41%
INTRAMUSCULAR | Status: DC | PRN
  Administered 2018-03-23: 6 mL

## 2018-03-23 MED ORDER — ROCURONIUM BROMIDE 100 MG/10ML IV SOLN
INTRAVENOUS | Status: DC | PRN
  Administered 2018-03-23: 50 mg via INTRAVENOUS

## 2018-03-23 MED ORDER — OXYCODONE HCL 5 MG PO TABS: 5.0000 mg | ORAL_TABLET | Freq: Once | ORAL | Status: DC | PRN

## 2018-03-23 MED ORDER — SUGAMMADEX SODIUM 500 MG/5ML IV SOLN
INTRAVENOUS | Status: AC
  Filled 2018-03-23: qty 5

## 2018-03-23 MED ORDER — MIDAZOLAM HCL 2 MG/2ML IJ SOLN
INTRAMUSCULAR | Status: DC | PRN
  Administered 2018-03-23: 2 mg via INTRAVENOUS

## 2018-03-23 MED ORDER — CEFAZOLIN SODIUM-DEXTROSE 2-4 GM/100ML-% IV SOLN
INTRAVENOUS | Status: AC
  Filled 2018-03-23: qty 100

## 2018-03-23 MED ORDER — DEXAMETHASONE SODIUM PHOSPHATE 10 MG/ML IJ SOLN
INTRAMUSCULAR | Status: DC | PRN
  Administered 2018-03-23: 10 mg via INTRAVENOUS

## 2018-03-23 MED ORDER — OXYBUTYNIN CHLORIDE ER 10 MG PO TB24: 10.0000 mg | ORAL_TABLET | Freq: Every day | ORAL | 0 refills | Status: AC

## 2018-03-23 SURGICAL SUPPLY — 35 items
BAG DRAIN CYSTO-URO LG1000N (MISCELLANEOUS) ×4 IMPLANT
BRUSH SCRUB EZ 1% IODOPHOR (MISCELLANEOUS) ×4 IMPLANT
BULB IRRIG PATHFIND (MISCELLANEOUS) IMPLANT
CATH COUDE FOLEY 2W 5CC 18FR (CATHETERS) ×2 IMPLANT
CATH URETL 5X70 OPEN END (CATHETERS) ×2 IMPLANT
CNTNR SPEC 2.5X3XGRAD LEK (MISCELLANEOUS)
CONT SPEC 4OZ STER OR WHT (MISCELLANEOUS)
CONTAINER SPEC 2.5X3XGRAD LEK (MISCELLANEOUS) IMPLANT
DRAPE UTILITY 15X26 TOWEL STRL (DRAPES) ×4 IMPLANT
FIBER LASER LITHO 273 (Laser) IMPLANT
GLOVE BIOGEL PI IND STRL 7.5 (GLOVE) ×2 IMPLANT
GLOVE BIOGEL PI INDICATOR 7.5 (GLOVE) ×4
GOWN STRL REUS W/ TWL LRG LVL3 (GOWN DISPOSABLE) ×2 IMPLANT
GOWN STRL REUS W/ TWL XL LVL3 (GOWN DISPOSABLE) ×2 IMPLANT
GOWN STRL REUS W/TWL LRG LVL3 (GOWN DISPOSABLE) ×2
GOWN STRL REUS W/TWL XL LVL3 (GOWN DISPOSABLE) ×2
GUIDEWIRE INTRO SET STRAIGHT (WIRE) ×2 IMPLANT
INFUSOR MANOMETER BAG 3000ML (MISCELLANEOUS) ×4 IMPLANT
INTRODUCER DILATOR DOUBLE (INTRODUCER) ×2 IMPLANT
KIT TURNOVER CYSTO (KITS) ×4 IMPLANT
PACK CYSTO AR (MISCELLANEOUS) ×4 IMPLANT
SENSORWIRE 0.038 NOT ANGLED (WIRE) ×8
SET CYSTO W/LG BORE CLAMP LF (SET/KITS/TRAYS/PACK) ×4 IMPLANT
SHEATH URETERAL 12FRX35CM (MISCELLANEOUS) IMPLANT
SOL .9 NS 3000ML IRR  AL (IV SOLUTION) ×2
SOL .9 NS 3000ML IRR UROMATIC (IV SOLUTION) ×2 IMPLANT
STENT URET 6FRX24 CONTOUR (STENTS) IMPLANT
STENT URET 6FRX26 CONTOUR (STENTS) IMPLANT
STENT URET 6FRX28 CONTOUR (STENTS) ×2 IMPLANT
SURGILUBE 2OZ TUBE FLIPTOP (MISCELLANEOUS) ×4 IMPLANT
SYR 10ML LL (SYRINGE) ×4 IMPLANT
TUBING ART PRESS 48 MALE/FEM (TUBING) IMPLANT
VALVE UROSEAL ADJ ENDO (VALVE) ×2 IMPLANT
WATER STERILE IRR 1000ML POUR (IV SOLUTION) ×4 IMPLANT
WIRE SENSOR 0.038 NOT ANGLED (WIRE) ×2 IMPLANT

## 2018-03-23 NOTE — Discharge Instructions (Signed)

## 2018-03-23 NOTE — Anesthesia Postprocedure Evaluation (Signed)
Anesthesia Post Note  Patient: TEAL BONTRAGER  Procedure(s) Performed: CYSTOSCOPY WITH RETROGRADE PYELOGRAM (Right ) URETERAL DILITATION (Right Ureter) CYSTOSCOPY WITH STENT PLACEMENT (Right Ureter)  Patient location during evaluation: PACU Anesthesia Type: General Level of consciousness: awake and alert and oriented Pain management: pain level controlled Vital Signs Assessment: post-procedure vital signs reviewed and stable Respiratory status: spontaneous breathing, nonlabored ventilation and respiratory function stable Cardiovascular status: blood pressure returned to baseline and stable Postop Assessment: no signs of nausea or vomiting Anesthetic complications: no     Last Vitals:  Vitals:   03/23/18 1130 03/23/18 1144  BP: 107/72 (!) 106/55  Pulse: 89 80  Resp: 12 13  Temp: 37.1 C   SpO2: 99% 97%    Last Pain:  Vitals:   03/23/18 1144  PainSc: 0-No pain                 Magnolia Mattila

## 2018-03-23 NOTE — Op Note (Signed)
Date of procedure: 03/23/18  Preoperative diagnosis:  1. 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 Same  Procedure: 1. Cystoscopy, right retrograde pyelogram, right ureteral stent placement  Surgeon: Nickolas Madrid, MD  Anesthesia: General  Complications: None  Intraoperative findings:  1.  Diffusely narrowed urethra, able to accommodate 21 French cystoscope 2.  Moderate size prostate with high bladder neck 3.  No bladder lesions, ureteral orifices orthotopic 4.  Unable to pass flexible ureteroscope despite ureteral dilation, 48F x 28cm stent placed for passive dilation  EBL: Minimal  Specimens: None  Drains:  Right 6 French by 28 cm ureteral stent 18 French coud Foley(to be removed in PACU)  Indication: Christian Cline is a 68 y.o. patient with severe right-sided flank pain and multiple ER visits over the last few weeks with CT scan showing a right 1.3 cm UPJ stone with upstream hydronephrosis.  After reviewing the management options for treatment, they elected to proceed with the above surgical procedure(s). We have discussed the potential benefits and risks of the procedure, side effects of the proposed treatment, the likelihood of the patient achieving the goals of the procedure, and any potential problems that might occur during the procedure or recuperation. Informed consent has been obtained.  Description of procedure:  The patient was taken to the operating room and general anesthesia was induced.  The patient was placed in the dorsal lithotomy position, prepped and draped in the usual sterile fashion, and preoperative antibiotics were administered. A preoperative time-out was performed.   The 21 French rigid cystoscope was used to intubate the urethra.  The urethra was diffusely narrowed, however we were able to pass the rigid scope into the bladder.  There was a moderate size prostate with a high bladder neck.  Thorough cystoscopy was performed which showed  normal mucosa throughout and no suspicious lesions.  The ureteral orifices were orthotopic bilaterally.  I started by performing a right retrograde pyelogram using a 5 French access catheter that showed normal-appearing ureter up to a filling defect at the right UPJ with upstream hydronephrosis.  The stone was not clearly seen on fluoroscopy.  A sensor wire was then advanced alongside the stone into the upper pole.  The ureteral orifice appeared quite narrow, and I attempted to perform dilation with a dual-lumen access catheter, however this was unable to be passed into the distal ureter.  I then transitioned to the 8/10 [place] dilators, and was able to just get the 10 [place] inside the right distal ureter.  A second sensor safety wire was advanced through the 10 [place] into the upper pole, and the dilator was removed.  I then attempted to pass the single-channel digital flexible ureteroscope over the wire but met significant resistance at the ureteral orifice.  Despite numerous attempts to change the angle, the scope continued to buckle and would not advance into the ureter.  At this point, I elected to place a ureteral stent for passive dilation.  The extra wire was removed, and the rigid cystoscope backloaded over the wire.  A 6 French by 26 cm ureteral stent was passed under direct vision, however we were unable to get a satisfactory curl proximally in the renal pelvis.  I elected to remove the stent and passed a sensor wire back into the upper pole to place a longer stent.  I was able to then passed a 6 [place] by 28 cm ureteral stent through the rigid cystoscope with an excellent curl in the upper  pole as well as under direct vision in the bladder.  There was cloudy urine draining through the side ports of the stent.  I elected to place an 31 [place] two-way coud Foley to maximize drainage for 1 to 2 hours postoperatively.  This passed easily into the bladder with return of clear fluid.  10 cc were placed in  the balloon.  Disposition: Stable to PACU  Plan: Remove Foley 1 hour post-op, patient must void prior to discharge Bactrim prophylaxis while stent in place Follow-up for repeat right ureteroscopy, laser lithotripsy, stent exchange in 2 to 3 weeks Do not resume Eliquis until stone treated, baby aspirin okay  Nickolas Madrid, MD

## 2018-03-23 NOTE — H&P (Signed)
UROLOGY H&P UPDATE  Agree with prior H&P dated 03/16/2018.  Co-morbid male with CKD and COPD, and 1.3 cm right UPJ stone with flank pain.  Cardiac: RRR Lungs: CTA bilaterally  Laterality: Right Procedure: Right ureteroscopy, laser lithotripsy, stent placement  Urinalysis: Urine culture 03/16/2018 no growth  Informed consent obtained, we specifically discussed the risk of bleeding, infection, possible inability to access collecting system requiring temporary stent placement for passive dilation and staged procedure, stent related symptoms including dysuria, urgency, frequency, and flank pain, and possible need for additional procedures.  Billey Co, MD 03/23/2018

## 2018-03-23 NOTE — Anesthesia Post-op Follow-up Note (Signed)
Anesthesia QCDR form completed.        

## 2018-03-23 NOTE — Progress Notes (Signed)
Informed Dr. Diamantina Providence pt did not finish ABX

## 2018-03-23 NOTE — Anesthesia Procedure Notes (Signed)
Procedure Name: Intubation Date/Time: 03/23/2018 10:32 AM Performed by: Kelton Pillar, CRNA Pre-anesthesia Checklist: Patient identified, Emergency Drugs available, Suction available and Patient being monitored Patient Re-evaluated:Patient Re-evaluated prior to induction Oxygen Delivery Method: Circle system utilized Preoxygenation: Pre-oxygenation with 100% oxygen Induction Type: IV induction Ventilation: Mask ventilation without difficulty Laryngoscope Size: McGraph and 3 Grade View: Grade I Tube type: Oral Tube size: 7.0 mm Number of attempts: 1 Airway Equipment and Method: Stylet Placement Confirmation: ETT inserted through vocal cords under direct vision,  positive ETCO2,  breath sounds checked- equal and bilateral and CO2 detector Secured at: 22 cm Tube secured with: Tape Dental Injury: Teeth and Oropharynx as per pre-operative assessment

## 2018-03-23 NOTE — Anesthesia Preprocedure Evaluation (Signed)
Anesthesia Evaluation  Patient identified by MRN, date of birth, ID band Patient awake    Reviewed: Allergy & Precautions, NPO status , Patient's Chart, lab work & pertinent test results  History of Anesthesia Complications Negative for: history of anesthetic complications  Airway Mallampati: II  TM Distance: >3 FB Neck ROM: Full    Dental no notable dental hx.    Pulmonary sleep apnea (stopped wearing CPAP after episode of Bell's palsy due to poor mask fit) , COPD, Current Smoker,    breath sounds clear to auscultation- rhonchi (-) wheezing      Cardiovascular Exercise Tolerance: Good hypertension, Pt. on medications (-) CAD, (-) Past MI, (-) Cardiac Stents and (-) CABG  Rhythm:Regular Rate:Normal - Systolic murmurs and - Diastolic murmurs    Neuro/Psych neg Seizures negative neurological ROS  negative psych ROS   GI/Hepatic Neg liver ROS, GERD  ,  Endo/Other  negative endocrine ROSneg diabetes  Renal/GU negative Renal ROS     Musculoskeletal  (+) Arthritis ,   Abdominal (+) + obese,   Peds  Hematology negative hematology ROS (+)   Anesthesia Other Findings Past Medical History: No date: Acid reflux No date: Arthritis No date: COPD (chronic obstructive pulmonary disease) (HCC) No date: Gout No date: Heart murmur No date: History of kidney stones No date: Hyperlipidemia No date: Hypertension No date: Sleep apnea   Reproductive/Obstetrics                             Anesthesia Physical Anesthesia Plan  ASA: II  Anesthesia Plan: General   Post-op Pain Management:    Induction: Intravenous  PONV Risk Score and Plan: 0 and Ondansetron  Airway Management Planned: Oral ETT  Additional Equipment:   Intra-op Plan:   Post-operative Plan: Extubation in OR  Informed Consent: I have reviewed the patients History and Physical, chart, labs and discussed the procedure including  the risks, benefits and alternatives for the proposed anesthesia with the patient or authorized representative who has indicated his/her understanding and acceptance.   Dental advisory given  Plan Discussed with: CRNA and Anesthesiologist  Anesthesia Plan Comments:         Anesthesia Quick Evaluation

## 2018-03-23 NOTE — Transfer of Care (Signed)
Immediate Anesthesia Transfer of Care Note  Patient: Christian Cline  Procedure(s) Performed: CYSTOSCOPY WITH RETROGRADE PYELOGRAM (Right ) URETERAL DILITATION (Right Ureter) CYSTOSCOPY WITH STENT PLACEMENT (Right Ureter)  Patient Location: PACU  Anesthesia Type:General  Level of Consciousness: awake  Airway & Oxygen Therapy: Patient Spontanous Breathing and Patient connected to face mask oxygen  Post-op Assessment: Report given to RN and Post -op Vital signs reviewed and stable  Post vital signs: Reviewed  Last Vitals:  Vitals Value Taken Time  BP 107/72 03/23/2018 11:30 AM  Temp    Pulse 86 03/23/2018 11:30 AM  Resp 17 03/23/2018 11:30 AM  SpO2 100 % 03/23/2018 11:30 AM  Vitals shown include unvalidated device data.  Last Pain: There were no vitals filed for this visit.       Complications: No apparent anesthesia complications

## 2018-03-26 ENCOUNTER — Telehealth: Payer: Self-pay | Admitting: Radiology

## 2018-03-26 ENCOUNTER — Other Ambulatory Visit: Payer: Self-pay | Admitting: Radiology

## 2018-03-26 DIAGNOSIS — N201 Calculus of ureter: Secondary | ICD-10-CM

## 2018-03-26 NOTE — Telephone Encounter (Signed)
Discussed the [facility] Information form below with patient over the phone.  [place], [address] [place], [place] [address] Telephone: [phone] Fax: [phone]   Thank you for choosing [facility] for your upcoming surgery!  We are always here to assist in your urological needs.  Please read the following information with specific details for your upcoming appointments related to your surgery. Please contact Dalonda Simoni at [phone] Option 3 with any questions.  The Name of Your Surgery: right ureteroscopy, laser lithotripsy, stone removal, right ureteral stent exchange. Your Surgery Date: 04/06/2018 Your Surgeon: Nickolas Madrid  Please call Same Day Surgery at [phone] between the hours of 1pm-3pm one day prior to your surgery. They will inform you of the time to arrive at Same Day Surgery which is located on the second floor of the [facility].  Pre-admission testing appointment will not need to be repeated but follow instructions given by anesthesia nurse for your recent surgery.  Patient was advised to have nothing to eat or drink after midnight the night prior to surgery except that he may have only water until 2 hours before surgery with nothing to drink within 2 hours of surgery.  Patient was advised to continue taking aspirin 81mg  through the day prior to surgery, holding medication of the day of surgery. Patient was advised to hold Eliquis until after this surgery per Dr Doristine Counter post op note below. This was confirmed with Dr Diamantina Providence prior to advising patient. Patient's questions were answered and he expressed understanding of these instructions.   Plan: Follow-up for repeat right ureteroscopy, laser lithotripsy, stent exchange in 2 to 3 weeks Do not resume Eliquis until stone treated, baby aspirin okay  Nickolas Madrid, MD

## 2018-03-27 ENCOUNTER — Other Ambulatory Visit: Payer: Medicare Other

## 2018-03-27 DIAGNOSIS — N201 Calculus of ureter: Secondary | ICD-10-CM

## 2018-03-27 LAB — MICROSCOPIC EXAMINATION: RBC, UA: >30 /hpf — ABNORMAL HIGH (ref 0–2)

## 2018-03-27 LAB — URINALYSIS, COMPLETE
Bilirubin, UA: NEGATIVE
Glucose, UA: NEGATIVE
Ketones, UA: NEGATIVE
Nitrite, UA: NEGATIVE
Specific Gravity, UA: 1.02 (ref 1.005–1.030)
Urobilinogen, Ur: 1 mg/dL (ref 0.2–1.0)
pH, UA: 6 (ref 5.0–7.5)

## 2018-03-31 LAB — CULTURE, URINE COMPREHENSIVE

## 2018-04-04 NOTE — Patient Instructions (Signed)
Your procedure is scheduled on: 04/06/18 Report to Day Surgery. [facility]OND FLOOR To find out your arrival time please call [phone] between 1PM - 3PM on 04/05/18.  Remember: Instructions that are not followed completely may result in serious medical risk,  up to and including death, or upon the discretion of your surgeon and anesthesiologist your  surgery may need to be rescheduled.     _X__ 1. Do not eat food after midnight the night before your procedure.                 No gum chewing or hard candies. You may drink clear liquids up to 2 hours                 before you are scheduled to arrive for your surgery- DO not drink clear                 liquids within 2 hours of the start of your surgery.                 Clear Liquids include:  water, apple juice without pulp, clear carbohydrate                 drink such as Clearfast of Gatorade, Black Coffee or Tea (Do not add                 anything to coffee or tea).  __X__2.  On the morning of surgery brush your teeth with toothpaste and water, you                may rinse your mouth with mouthwash if you wish.  Do not swallow any toothpaste of mouthwash.     _X__ 3.  No Alcohol for 24 hours before or after surgery.   _X__ 4.  Do Not Smoke or use e-cigarettes For 24 Hours Prior to Your Surgery.                 Do not use any chewable tobacco products for at least 6 hours prior to                 surgery.  ____  5.  Bring all medications with you on the day of surgery if instructed.   ____  6.  Notify your doctor if there is any change in your medical condition      (cold, fever, infections).     Do not wear jewelry, make-up, hairpins, clips or nail polish. Do not wear lotions, powders, or perfumes. You may wear deodorant. Do not shave 48 hours prior to surgery. Men may shave face and neck. Do not bring valuables to the hospital.    [facility] is not responsible for any belongings or  valuables.  Contacts, dentures or bridgework may not be worn into surgery. Leave your suitcase in the car. After surgery it may be brought to your room. For patients admitted to the hospital, discharge time is determined by your treatment team.   Patients discharged the day of surgery will not be allowed to drive home.    __X__ Take these medicines the morning of surgery with A SIP OF WATER:    1. ALLOPUINOL  2. LIPITOR  3. PEPCID  4. TAMSULOSIN  5.  6.  ____ Fleet Enema (as directed)   ____ Use CHG Soap as directed  __X__ Use inhalers on the day of surgery AND BRING  ____ Stop metformin 2 days prior  to surgery    ____ Take 1/2 of usual insulin dose the night before surgery. No insulin the morning          of surgery.   __X__ Stop Coumadin/Plavix/aspirin on   OFF OF ELIQUIS . OK TO CONTINUE ASPIRIN 81 MG  ____ Stop Anti-inflammatories on   ____ Stop supplements until after surgery.    ____ Bring C-Pap to the hospital.

## 2018-04-05 MED ORDER — CEFAZOLIN SODIUM-DEXTROSE 2-4 GM/100ML-% IV SOLN
2.0000 g | INTRAVENOUS | Status: AC
  Administered 2018-04-06: 2 g via INTRAVENOUS

## 2018-04-06 ENCOUNTER — Ambulatory Visit: Payer: Medicare Other | Admitting: Anesthesiology

## 2018-04-06 ENCOUNTER — Ambulatory Visit
Admission: RE | Admit: 2018-04-06 | Discharge: 2018-04-06 | Disposition: A | Discharge status: Home / Self Care | Payer: Medicare Other | Attending: Urology | Admitting: Urology

## 2018-04-06 ENCOUNTER — Other Ambulatory Visit: Payer: Self-pay

## 2018-04-06 ENCOUNTER — Encounter
Admission: RE | Disposition: A | Discharge status: Home / Self Care | Payer: Self-pay | Source: Home / Self Care | Attending: Urology

## 2018-04-06 DIAGNOSIS — G4730 Sleep apnea, unspecified: Secondary | ICD-10-CM | POA: Insufficient documentation

## 2018-04-06 DIAGNOSIS — I10 Essential (primary) hypertension: Secondary | ICD-10-CM | POA: Insufficient documentation

## 2018-04-06 DIAGNOSIS — J449 Chronic obstructive pulmonary disease, unspecified: Secondary | ICD-10-CM | POA: Insufficient documentation

## 2018-04-06 DIAGNOSIS — N201 Calculus of ureter: Secondary | ICD-10-CM | POA: Diagnosis present

## 2018-04-06 DIAGNOSIS — F17200 Nicotine dependence, unspecified, uncomplicated: Secondary | ICD-10-CM | POA: Insufficient documentation

## 2018-04-06 HISTORY — PX: CYSTOSCOPY/URETEROSCOPY/HOLMIUM LASER/STENT PLACEMENT: SHX6546

## 2018-04-06 SURGERY — CYSTOSCOPY/URETEROSCOPY/HOLMIUM LASER/STENT PLACEMENT
Anesthesia: General | Laterality: Right

## 2018-04-06 MED ORDER — SULFAMETHOXAZOLE-TRIMETHOPRIM 800-160 MG PO TABS: 1.0000 | ORAL_TABLET | Freq: Every day | ORAL | 0 refills | Status: DC

## 2018-04-06 MED ORDER — ACETAMINOPHEN 10 MG/ML IV SOLN
INTRAVENOUS | Status: DC | PRN
  Administered 2018-04-06: 1000 mg via INTRAVENOUS

## 2018-04-06 MED ORDER — FENTANYL CITRATE (PF) 100 MCG/2ML IJ SOLN
INTRAMUSCULAR | Status: DC | PRN
  Administered 2018-04-06: 100 ug via INTRAVENOUS
  Administered 2018-04-06 (×2): 50 ug via INTRAVENOUS

## 2018-04-06 MED ORDER — HYDROCODONE-ACETAMINOPHEN 5-325 MG PO TABS: 1.0000 | ORAL_TABLET | ORAL | 0 refills | Status: AC | PRN

## 2018-04-06 MED ORDER — ROCURONIUM BROMIDE 100 MG/10ML IV SOLN
INTRAVENOUS | Status: DC | PRN
  Administered 2018-04-06: 40 mg via INTRAVENOUS

## 2018-04-06 MED ORDER — HYDROMORPHONE HCL 1 MG/ML IJ SOLN: 0.2500 mg | INTRAMUSCULAR | Status: DC | PRN

## 2018-04-06 MED ORDER — HYDROCODONE-ACETAMINOPHEN 7.5-325 MG PO TABS: 1.0000 | ORAL_TABLET | Freq: Once | ORAL | Status: DC | PRN

## 2018-04-06 MED ORDER — SODIUM CHLORIDE 0.9 % IV SOLN
INTRAVENOUS | Status: DC
  Administered 2018-04-06: 13:00:00 via INTRAVENOUS

## 2018-04-06 MED ORDER — SUGAMMADEX SODIUM 200 MG/2ML IV SOLN
INTRAVENOUS | Status: AC
  Filled 2018-04-06: qty 2

## 2018-04-06 MED ORDER — PHENYLEPHRINE HCL 10 MG/ML IJ SOLN
INTRAMUSCULAR | Status: DC | PRN
  Administered 2018-04-06 (×2): 100 ug via INTRAVENOUS

## 2018-04-06 MED ORDER — ACETAMINOPHEN 325 MG PO TABS: 325.0000 mg | ORAL_TABLET | ORAL | Status: DC | PRN

## 2018-04-06 MED ORDER — SUCCINYLCHOLINE CHLORIDE 20 MG/ML IJ SOLN
INTRAMUSCULAR | Status: DC | PRN
  Administered 2018-04-06: 140 mg via INTRAVENOUS

## 2018-04-06 MED ORDER — BELLADONNA ALKALOIDS-OPIUM 16.2-30 MG RE SUPP: 30.0000 mg | Freq: Four times a day (QID) | RECTAL | Status: DC | PRN

## 2018-04-06 MED ORDER — MIDAZOLAM HCL 2 MG/2ML IJ SOLN
INTRAMUSCULAR | Status: DC | PRN
  Administered 2018-04-06: 2 mg via INTRAVENOUS

## 2018-04-06 MED ORDER — IOPAMIDOL (ISOVUE-M 200) INJECTION 41%
INTRAMUSCULAR | Status: DC | PRN
  Administered 2018-04-06: 15 mL

## 2018-04-06 MED ORDER — ROCURONIUM BROMIDE 50 MG/5ML IV SOLN
INTRAVENOUS | Status: AC
  Filled 2018-04-06: qty 1

## 2018-04-06 MED ORDER — LIDOCAINE HCL (CARDIAC) PF 100 MG/5ML IV SOSY
PREFILLED_SYRINGE | INTRAVENOUS | Status: DC | PRN
  Administered 2018-04-06: 100 mg via INTRAVENOUS

## 2018-04-06 MED ORDER — FENTANYL CITRATE (PF) 100 MCG/2ML IJ SOLN
INTRAMUSCULAR | Status: AC
  Filled 2018-04-06: qty 2

## 2018-04-06 MED ORDER — MEPERIDINE HCL 50 MG/ML IJ SOLN: 6.2500 mg | INTRAMUSCULAR | Status: DC | PRN

## 2018-04-06 MED ORDER — PROPOFOL 10 MG/ML IV BOLUS
INTRAVENOUS | Status: AC
  Filled 2018-04-06: qty 20

## 2018-04-06 MED ORDER — ONDANSETRON HCL 4 MG/2ML IJ SOLN
INTRAMUSCULAR | Status: DC | PRN
  Administered 2018-04-06: 4 mg via INTRAVENOUS

## 2018-04-06 MED ORDER — METOPROLOL TARTRATE 5 MG/5ML IV SOLN
INTRAVENOUS | Status: AC
  Filled 2018-04-06: qty 5

## 2018-04-06 MED ORDER — PROMETHAZINE HCL 25 MG/ML IJ SOLN: 6.2500 mg | INTRAMUSCULAR | Status: DC | PRN

## 2018-04-06 MED ORDER — LIDOCAINE HCL (PF) 2 % IJ SOLN
INTRAMUSCULAR | Status: AC
  Filled 2018-04-06: qty 10

## 2018-04-06 MED ORDER — CEFAZOLIN SODIUM-DEXTROSE 2-4 GM/100ML-% IV SOLN
INTRAVENOUS | Status: AC
  Filled 2018-04-06: qty 100

## 2018-04-06 MED ORDER — ACETAMINOPHEN 10 MG/ML IV SOLN
INTRAVENOUS | Status: AC
  Filled 2018-04-06: qty 100

## 2018-04-06 MED ORDER — ACETAMINOPHEN 160 MG/5ML PO SOLN: 325.0000 mg | ORAL | Status: DC | PRN

## 2018-04-06 MED ORDER — PROPOFOL 10 MG/ML IV BOLUS
INTRAVENOUS | Status: DC | PRN
  Administered 2018-04-06: 200 mg via INTRAVENOUS

## 2018-04-06 MED ORDER — MIDAZOLAM HCL 2 MG/2ML IJ SOLN
INTRAMUSCULAR | Status: AC
  Filled 2018-04-06: qty 2

## 2018-04-06 MED ORDER — SUGAMMADEX SODIUM 200 MG/2ML IV SOLN
INTRAVENOUS | Status: DC | PRN
  Administered 2018-04-06: 200 mg via INTRAVENOUS

## 2018-04-06 SURGICAL SUPPLY — 34 items
BAG DRAIN CYSTO-URO LG1000N (MISCELLANEOUS) ×3 IMPLANT
BRUSH SCRUB EZ 1% IODOPHOR (MISCELLANEOUS) ×3 IMPLANT
BULB IRRIG PATHFIND (MISCELLANEOUS) IMPLANT
CATH URETL 5X70 OPEN END (CATHETERS) IMPLANT
CNTNR SPEC 2.5X3XGRAD LEK (MISCELLANEOUS)
CONT SPEC 4OZ STER OR WHT (MISCELLANEOUS)
CONTAINER SPEC 2.5X3XGRAD LEK (MISCELLANEOUS) IMPLANT
DRAPE UTILITY 15X26 TOWEL STRL (DRAPES) ×3 IMPLANT
FIBER LASER LITHO 273 (Laser) ×3 IMPLANT
GLOVE BIOGEL PI IND STRL 7.5 (GLOVE) ×1 IMPLANT
GLOVE BIOGEL PI INDICATOR 7.5 (GLOVE) ×2
GOWN STRL REUS W/ TWL LRG LVL3 (GOWN DISPOSABLE) ×1 IMPLANT
GOWN STRL REUS W/ TWL XL LVL3 (GOWN DISPOSABLE) ×1 IMPLANT
GOWN STRL REUS W/TWL LRG LVL3 (GOWN DISPOSABLE) ×2
GOWN STRL REUS W/TWL XL LVL3 (GOWN DISPOSABLE) ×2
GUIDEWIRE GREEN .038 145CM (MISCELLANEOUS) ×3 IMPLANT
INFUSOR MANOMETER BAG 3000ML (MISCELLANEOUS) ×3 IMPLANT
INTRODUCER DILATOR DOUBLE (INTRODUCER) ×3 IMPLANT
KIT TURNOVER CYSTO (KITS) ×3 IMPLANT
PACK CYSTO AR (MISCELLANEOUS) ×3 IMPLANT
SENSORWIRE 0.038 NOT ANGLED (WIRE) ×3
SET CYSTO W/LG BORE CLAMP LF (SET/KITS/TRAYS/PACK) ×3 IMPLANT
SHEATH URETERAL 12FRX35CM (MISCELLANEOUS) IMPLANT
SOL .9 NS 3000ML IRR  AL (IV SOLUTION) ×2
SOL .9 NS 3000ML IRR UROMATIC (IV SOLUTION) ×1 IMPLANT
STENT URET 6FRX24 CONTOUR (STENTS) IMPLANT
STENT URET 6FRX26 CONTOUR (STENTS) IMPLANT
STENT URET 6FRX28 CONTOUR (STENTS) ×3 IMPLANT
SURGILUBE 2OZ TUBE FLIPTOP (MISCELLANEOUS) ×3 IMPLANT
SYR 10ML LL (SYRINGE) ×3 IMPLANT
TUBING ART PRESS 48 MALE/FEM (TUBING) IMPLANT
VALVE UROSEAL ADJ ENDO (VALVE) ×3 IMPLANT
WATER STERILE IRR 1000ML POUR (IV SOLUTION) ×3 IMPLANT
WIRE SENSOR 0.038 NOT ANGLED (WIRE) ×1 IMPLANT

## 2018-04-06 NOTE — Anesthesia Procedure Notes (Signed)
Procedure Name: Intubation Date/Time: 04/06/2018 1:53 PM Performed by: Lavone Orn, CRNA Pre-anesthesia Checklist: Patient identified, Emergency Drugs available, Suction available, Patient being monitored and Timeout performed Patient Re-evaluated:Patient Re-evaluated prior to induction Oxygen Delivery Method: Circle system utilized Preoxygenation: Pre-oxygenation with 100% oxygen Induction Type: IV induction Ventilation: Mask ventilation without difficulty Laryngoscope Size: Mac and 4 Grade View: Grade II Tube type: Oral Number of attempts: 1 Airway Equipment and Method: Stylet (cricoid pressure)

## 2018-04-06 NOTE — Anesthesia Post-op Follow-up Note (Signed)
Anesthesia QCDR form completed.        

## 2018-04-06 NOTE — Op Note (Signed)
Date of procedure: 04/06/18  Preoperative diagnosis:  1. 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 Same  Procedure: 1. Cystoscopy, right ureteroscopy, right retrograde pyelogram with intraoperative interpretation, laser lithotripsy, stent placement  Surgeon: Nickolas Madrid, MD  Anesthesia: General  Complications: None  Intraoperative findings:  1.  Narrowed urethra, dilated with Leander Rams sounds 2.  Despite pre-stenting, right ureter remained diffusely narrowed and difficult to access right UPJ stone 3.  Right UPJ stone dusted, unable to access small lower pole stone burden 4.  Uncomplicated right ureteral stent placement  EBL: Minimal  Specimens: None  Drains: Right 6 French by 28 cm ureteral stent  Indication: Christian Cline is a 68 y.o. patient with 1.3 cm right UPJ stone and flank pain.  We were unable to access the collecting system with the flexible ureteroscope on 03/23/2018, and a stent was placed for passive dilation.  After reviewing the management options for treatment, they elected to proceed with the above surgical procedure(s). We have discussed the potential benefits and risks of the procedure, side effects of the proposed treatment, the likelihood of the patient achieving the goals of the procedure, and any potential problems that might occur during the procedure or recuperation. Informed consent has been obtained.  Description of procedure:  The patient was taken to the operating room and general anesthesia was induced.  The patient was placed in the dorsal lithotomy position, prepped and draped in the usual sterile fashion, and preoperative antibiotics were administered. A preoperative time-out was performed.   Leander Rams sounds were used to dilate the urethra up to 26 [place].  The 21 French rigid cystoscope with a 30 degree lens was able to be passed into the bladder with mild resistance.  We grasped the right ureteral stent that was protruding from  the right ureteral orifice and this was pulled to the meatus.  A sensor wire was advanced through the stent into the collecting system under fluoroscopic vision and the old stent removed.  The rigid cystoscope was reentered into the bladder and an additional sensor safety wire was placed into the collecting system under fluoroscopic vision.  We attempted to pass the single-channel flexible ureteroscope over the wire however met resistance a few centimeters below the UPJ stone which could be seen on fluoroscopy.  Under direct vision the ureter appeared diffusely narrowed. Retrograde pyelogram demonstrated some narrowing of the ureter but contrast passed into the collecting system.  A Super Stiff guidewire was then passed through the flexible ureteroscope into the collecting system, however we were still unable to advance the scope up to the stone.  At this point the scope was removed and a dual-lumen access catheter was used to dilate the mid and proximal ureter up to the stone.  The flexible ureteroscope was advanced over the wire, however again met resistance, and ultimately I was able to remove the safety wire and the scope was able to pass up to the stone in the renal pelvis.  I immediately identified a large black stone and this was lasered to sub-1 mm fragments using a 270 m laser fiber on settings of 0.5 J and 30 Hz.  The stone was very hard.  I then attempted to access the small stone in the lower pole, however with a diffusely narrowed ureter I was unable to navigate into the lower pole collecting system to visualize the lower pole stone.  Thorough pyeloscopy revealed no significant residual fragments from the UPJ stone.  Retrograde pyelogram  was performed to aid in stent placement.  The sensor wire was replaced through the scope and careful pullback ureteroscopy demonstrated no residual fragments or ureteral injury.  A 21 French rigid cystoscope was backloaded over the wire, and a 6 [place] by 28 cm stent  was uneventfully placed under direct vision.  There was an excellent curl in the upper pole on fluoroscopy as well as under direct vision in the bladder.  The bladder was drained and this concluded our procedure  Disposition: Stable to PACU  Plan: Follow-up in 1 week for stent removal Bactrim prophylaxis while stent in place Okay to resume Eliquis in 4 days  Nickolas Madrid, MD

## 2018-04-06 NOTE — Transfer of Care (Signed)
Immediate Anesthesia Transfer of Care Note  Patient: Christian Cline  Procedure(s) Performed: CYSTOSCOPY/URETEROSCOPY/HOLMIUM LASER/STENT Exchange (Right )  Patient Location: PACU  Anesthesia Type:General  Level of Consciousness: drowsy  Airway & Oxygen Therapy: Patient Spontanous Breathing and Patient connected to face mask oxygen  Post-op Assessment: Report given to RN and Post -op Vital signs reviewed and stable  Post vital signs: stable  Last Vitals:  Vitals Value Taken Time  BP 126/77 04/06/2018  3:13 PM  Temp 36.4 C 04/06/2018  3:13 PM  Pulse 76 04/06/2018  3:19 PM  Resp 14 04/06/2018  3:19 PM  SpO2 98 % 04/06/2018  3:19 PM  Vitals shown include unvalidated device data.  Last Pain:  Vitals:   04/06/18 1513  TempSrc:   PainSc: 0-No pain         Complications: No apparent anesthesia complications

## 2018-04-06 NOTE — Anesthesia Preprocedure Evaluation (Addendum)
Anesthesia Evaluation  Patient identified by MRN, date of birth, ID band Patient awake    Reviewed: Allergy & Precautions, H&P , NPO status , reviewed documented beta blocker date and time   Airway Mallampati: II  TM Distance: >3 FB Neck ROM: full    Dental  (+) Partial Upper, Partial Lower   Pulmonary sleep apnea , COPD, Current Smoker,    Pulmonary exam normal        Cardiovascular hypertension, Normal cardiovascular exam+ Valvular Problems/Murmurs (murmur)      Neuro/Psych    GI/Hepatic GERD  Controlled and Medicated,  Endo/Other    Renal/GU      Musculoskeletal  (+) Arthritis ,   Abdominal   Peds  Hematology   Anesthesia Other Findings Past Medical History: No date: Acid reflux No date: Arthritis No date: COPD (chronic obstructive pulmonary disease) (HCC) No date: Gout No date: Heart murmur No date: History of kidney stones No date: Hyperlipidemia No date: Hypertension No date: Sleep apnea Past Surgical History: No date: COLONOSCOPY WITH PROPOFOL 03/23/2018: CYSTOSCOPY W/ RETROGRADES; Right     Comment:  Procedure: CYSTOSCOPY WITH RETROGRADE PYELOGRAM;                Surgeon: Billey Co, MD;  Location: [facility] ORS;                Service: Urology;  Laterality: Right; 03/23/2018: CYSTOSCOPY WITH STENT PLACEMENT; Right     Comment:  Procedure: CYSTOSCOPY WITH STENT PLACEMENT;  Surgeon:               Billey Co, MD;  Location: [facility] ORS;  Service:               Urology;  Laterality: Right; No date: KIDNEY STONE SURGERY No date: LASIK; Bilateral No date: SHOULDER ARTHROSCOPY BMI    Body Mass Index:  31.51 kg/m     Reproductive/Obstetrics                            Anesthesia Physical Anesthesia Plan  ASA: III  Anesthesia Plan: General   Post-op Pain Management:    Induction: Intravenous  PONV Risk Score and Plan: 3 and Ondansetron and Treatment may vary due to  age or medical condition  Airway Management Planned: Oral ETT  Additional Equipment:   Intra-op Plan:   Post-operative Plan: Extubation in OR  Informed Consent: I have reviewed the patients History and Physical, chart, labs and discussed the procedure including the risks, benefits and alternatives for the proposed anesthesia with the patient or authorized representative who has indicated his/her understanding and acceptance.     Dental Advisory Given  Plan Discussed with: CRNA  Anesthesia Plan Comments:        Anesthesia Quick Evaluation

## 2018-04-06 NOTE — H&P (Signed)
UROLOGY H&P UPDATE  Agree with prior H&P dated 03/16/2018. Right 1cm UPJ stone, 17mm lower pole stone, pre-stented 03/23/2018 for inability to access collecting system with ureteroscope.  Cardiac: RRR Lungs: CTA bilaterally  Laterality: Right Procedure: Right URS/LL/stent  Urine: Culture 1/14 negative  Informed consent obtained, we specifically discussed the risks of bleeding, infection, post-operative pain, need for additional procedures, stent related symptoms, and ureteral injury.  Billey Co, MD 04/06/2018

## 2018-04-06 NOTE — Discharge Instructions (Addendum)
AMBULATORY SURGERY  °DISCHARGE INSTRUCTIONS ° ° °1) The drugs that you were given will stay in your system until tomorrow so for the next 24 hours you should not: ° °A) Drive an automobile °B) Make any legal decisions °C) Drink any alcoholic beverage ° ° °2) You may resume regular meals tomorrow.  Today it is better to start with liquids and gradually work up to solid foods. ° °You may eat anything you prefer, but it is better to start with liquids, then soup and crackers, and gradually work up to solid foods. ° ° °3) Please notify your doctor immediately if you have any unusual bleeding, trouble breathing, redness and pain at the surgery site, drainage, fever, or pain not relieved by medication. ° ° ° °4) Additional Instructions: ° ° ° ° ° ° ° °Please contact your physician with any problems or Same Day Surgery at [phone], Monday through Friday 6 am to 4 pm, or [facility] Main number at [phone].AMBULATORY SURGERY  °DISCHARGE INSTRUCTIONS ° ° °5) The drugs that you were given will stay in your system until tomorrow so for the next 24 hours you should not: ° °D) Drive an automobile °E) Make any legal decisions °F) Drink any alcoholic beverage ° ° °6) You may resume regular meals tomorrow.  Today it is better to start with liquids and gradually work up to solid foods. ° °You may eat anything you prefer, but it is better to start with liquids, then soup and crackers, and gradually work up to solid foods. ° ° °7) Please notify your doctor immediately if you have any unusual bleeding, trouble breathing, redness and pain at the surgery site, drainage, fever, or pain not relieved by medication. ° ° ° °8) Additional Instructions: ° ° ° ° ° ° ° °Please contact your physician with any problems or Same Day Surgery at [phone], Monday through Friday 6 am to 4 pm, or [facility] [place] Main number at [phone]. °

## 2018-04-07 ENCOUNTER — Other Ambulatory Visit: Payer: Self-pay | Admitting: Urology

## 2018-04-09 ENCOUNTER — Encounter: Payer: Self-pay | Admitting: Urology

## 2018-04-10 NOTE — Anesthesia Postprocedure Evaluation (Signed)
Anesthesia Post Note  Patient: Christian Cline  Procedure(s) Performed: CYSTOSCOPY/URETEROSCOPY/HOLMIUM LASER/STENT Exchange (Right )  Patient location during evaluation: PACU Anesthesia Type: General Level of consciousness: awake and alert Pain management: pain level controlled Vital Signs Assessment: post-procedure vital signs reviewed and stable Respiratory status: spontaneous breathing, nonlabored ventilation and respiratory function stable Cardiovascular status: blood pressure returned to baseline and stable Postop Assessment: no apparent nausea or vomiting Anesthetic complications: no     Last Vitals:  Vitals:   04/06/18 1641 04/06/18 1643  BP: 127/82   Pulse: 65   Resp: 16   Temp: (!) 35.7 C 36.5 C  SpO2: 100%     Last Pain:  Vitals:   04/06/18 1643  TempSrc: Oral  PainSc:                  Alphonsus Sias

## 2018-04-16 ENCOUNTER — Ambulatory Visit: Payer: Medicare Other | Admitting: Urology

## 2018-04-16 ENCOUNTER — Encounter: Payer: Self-pay | Admitting: Urology

## 2018-04-16 VITALS — BP 98/65 | HR 120 | Temp 97.8°F | Ht 70.0 in | Wt 220.0 lb

## 2018-04-16 DIAGNOSIS — N201 Calculus of ureter: Secondary | ICD-10-CM | POA: Diagnosis not present

## 2018-04-16 DIAGNOSIS — N200 Calculus of kidney: Secondary | ICD-10-CM

## 2018-04-16 LAB — URINALYSIS, COMPLETE
Bilirubin, UA: NEGATIVE
Glucose, UA: NEGATIVE
Ketones, UA: NEGATIVE
Nitrite, UA: NEGATIVE
Specific Gravity, UA: 1.02 (ref 1.005–1.030)
Urobilinogen, Ur: 0.2 mg/dL (ref 0.2–1.0)
pH, UA: 5.5 (ref 5.0–7.5)

## 2018-04-16 LAB — MICROSCOPIC EXAMINATION

## 2018-04-16 NOTE — Patient Instructions (Addendum)
[facility] Instructions [facility]  You will receive a box/kit in the mail that will have a urine jug and instructions in the kit.  When the box arrives you will need to call our office [phone] to schedule a LAB appointment.  You will need to do a 24hour urine and this should be done during the days that our office will be open.  For example any day from Sunday through Thursday.  How to collect the urine sample: On the day you start the urine sample this 1st morning urine should NOT be collected.  For the rest of the day including all night urines should be collected.  On the next morning the 1st urine should be collected and then you will be finished with the urine collections.  You will need to bring the box with you on your LAB appointment day after urine has been collected and all instructions are complete in the box.  Your blood will be drawn and the box will be collected by our Lab employee to be sent off for analysis.  When urine and blood is complete you will need to schedule a follow up appointment for lab results.  Dietary Guidelines to Help Prevent Kidney Stones Kidney stones are deposits of minerals and salts that form inside your kidneys. Your risk of developing kidney stones may be greater depending on your diet, your lifestyle, the medicines you take, and whether you have certain medical conditions. Most people can reduce their chances of developing kidney stones by following the instructions below. Depending on your overall health and the type of kidney stones you tend to develop, your dietitian may give you more specific instructions. What are tips for following this plan? Reading food labels  Choose foods with "no salt added" or "low-salt" labels. Limit your sodium intake to less than 1500 mg per day.  Choose foods with calcium for each meal and snack. Try to eat about 300 mg of calcium at each meal. Foods that contain 200-500 mg of calcium per serving  include: ? 8 oz (237 ml) of milk, fortified nondairy milk, and fortified fruit juice. ? 8 oz (237 ml) of kefir, yogurt, and soy yogurt. ? 4 oz (118 ml) of tofu. ? 1 oz of cheese. ? 1 cup (300 g) of dried figs. ? 1 cup (91 g) of cooked broccoli. ? 1-3 oz can of sardines or mackerel.  Most people need 1000 to 1500 mg of calcium each day. Talk to your dietitian about how much calcium is recommended for you. Shopping  Buy plenty of fresh fruits and vegetables. Most people do not need to avoid fruits and vegetables, even if they contain nutrients that may contribute to kidney stones.  When shopping for convenience foods, choose: ? Whole pieces of fruit. ? Premade salads with dressing on the side. ? Low-fat fruit and yogurt smoothies.  Avoid buying frozen meals or prepared deli foods.  Look for foods with live cultures, such as yogurt and kefir. Cooking  Do not add salt to food when cooking. Place a salt shaker on the table and allow each person to add his or her own salt to taste.  Use vegetable protein, such as beans, textured vegetable protein (TVP), or tofu instead of meat in pasta, casseroles, and soups. Meal planning   Eat less salt, if told by your dietitian. To do this: ? Avoid eating processed or premade food. ? Avoid eating fast food.  Eat less animal protein, including cheese, meat, poultry,  or fish, if told by your dietitian. To do this: ? Limit the number of times you have meat, poultry, fish, or cheese each week. Eat a diet free of meat at least 2 days a week. ? Eat only one serving each day of meat, poultry, fish, or seafood. ? When you prepare animal protein, cut pieces into small portion sizes. For most meat and fish, one serving is about the size of one deck of cards.  Eat at least 5 servings of fresh fruits and vegetables each day. To do this: ? Keep fruits and vegetables on hand for snacks. ? Eat 1 piece of fruit or a handful of berries with breakfast. ? Have a  salad and fruit at lunch. ? Have two kinds of vegetables at dinner.  Limit foods that are high in a substance called oxalate. These include: ? Spinach. ? Rhubarb. ? Beets. ? Potato chips and french fries. ? Nuts.  If you regularly take a diuretic medicine, make sure to eat at least 1-2 fruits or vegetables high in potassium each day. These include: ? Avocado. ? Banana. ? Orange, prune, carrot, or tomato juice. ? Baked potato. ? Cabbage. ? Beans and split peas. General instructions   Drink enough fluid to keep your urine clear or pale yellow. This is the most important thing you can do.  Talk to your health care provider and dietitian about taking daily supplements. Depending on your health and the cause of your kidney stones, you may be advised: ? Not to take supplements with vitamin C. ? To take a calcium supplement. ? To take a daily probiotic supplement. ? To take other supplements such as magnesium, fish oil, or vitamin B6.  Take all medicines and supplements as told by your health care provider.  Limit alcohol intake to no more than 1 drink a day for nonpregnant women and 2 drinks a day for men. One drink equals 12 oz of beer, 5 oz of wine, or 1 oz of hard liquor.  Lose weight if told by your health care provider. Work with your dietitian to find strategies and an eating plan that works best for you. What foods are not recommended? Limit your intake of the following foods, or as told by your dietitian. Talk to your dietitian about specific foods you should avoid based on the type of kidney stones and your overall health. Grains Breads. Bagels. Rolls. Baked goods. Salted crackers. Cereal. Pasta. Vegetables Spinach. Rhubarb. Beets. Canned vegetables. Angie Fava. Olives. Meats and other protein foods Nuts. Nut butters. Large portions of meat, poultry, or fish. Salted or cured meats. Deli meats. Hot dogs. Sausages. Dairy Cheese. Beverages Regular soft drinks. Regular  vegetable juice. Seasonings and other foods Seasoning blends with salt. Salad dressings. Canned soups. Soy sauce. Ketchup. Barbecue sauce. Canned pasta sauce. Casseroles. Pizza. Lasagna. Frozen meals. Potato chips. [place] fries. Summary  You can reduce your risk of kidney stones by making changes to your diet.  The most important thing you can do is drink enough fluid. You should drink enough fluid to keep your urine clear or pale yellow.  Ask your health care provider or dietitian how much protein from animal sources you should eat each day, and also how much salt and calcium you should have each day. This information is not intended to replace advice given to you by your health care provider. Make sure you discuss any questions you have with your health care provider. Document Released: 06/25/2010 Document Revised: 02/09/2016 Document Reviewed: 02/09/2016  Chartered certified accountant Patient Education  [employer].

## 2018-04-16 NOTE — Progress Notes (Addendum)
Cystoscopy Procedure Note:  Indication: Stent removal s/p 04/06/2018  right URS for 1.3cm UPJ stone  After informed consent and discussion of the procedure and its risks, TRAYVON TRUMBULL was positioned and prepped in the standard fashion. Cystoscopy was performed with a flexible cystoscope. The stent was grasped with flexible graspers and removed in its entirety. The patient tolerated the procedure well.  Findings: Uncomplicated stent removal  Assessment and Plan: Renal ultrasound 4 weeks, 24 hour urine evaluation, RTC to discuss results Follow up in 6 months with KUB  We discussed general stone prevention strategies including adequate hydration with goal of producing 2.5 L of urine daily, increasing citric acid intake, increasing calcium intake during high oxalate meals, minimizing animal protein, and decreasing salt intake. Information about dietary recommendations given today.   Billey Co, MD 04/16/2018

## 2018-05-05 ENCOUNTER — Other Ambulatory Visit: Payer: Self-pay | Admitting: Urology

## 2018-05-15 ENCOUNTER — Ambulatory Visit
Admission: RE | Admit: 2018-05-15 | Discharge: 2018-05-15 | Disposition: A | Discharge status: Home / Self Care | Payer: Medicare Other | Source: Ambulatory Visit | Attending: Urology | Admitting: Urology

## 2018-05-15 ENCOUNTER — Ambulatory Visit: Admission: RE | Admit: 2018-05-15 | Payer: Medicare Other | Source: Ambulatory Visit

## 2018-05-15 ENCOUNTER — Other Ambulatory Visit: Payer: Self-pay

## 2018-05-15 DIAGNOSIS — N201 Calculus of ureter: Secondary | ICD-10-CM | POA: Diagnosis present

## 2018-05-21 ENCOUNTER — Telehealth: Payer: Self-pay | Admitting: Urology

## 2018-05-21 ENCOUNTER — Other Ambulatory Visit: Payer: Self-pay | Admitting: Urology

## 2018-05-21 MED ORDER — TAMSULOSIN HCL 0.4 MG PO CAPS: 0.4000 mg | ORAL_CAPSULE | Freq: Every day | ORAL | 1 refills | Status: DC

## 2018-05-21 NOTE — Telephone Encounter (Signed)
Script refaxed to pharmacy

## 2018-05-21 NOTE — Telephone Encounter (Signed)
I received a message that the Flomax prescription did not go through to his pharmacy.

## 2018-05-23 ENCOUNTER — Telehealth: Payer: Self-pay | Admitting: Urology

## 2018-05-23 ENCOUNTER — Ambulatory Visit: Payer: Medicare Other | Admitting: Urology

## 2018-05-23 ENCOUNTER — Encounter: Payer: Self-pay | Admitting: Urology

## 2018-05-23 ENCOUNTER — Other Ambulatory Visit: Payer: Self-pay

## 2018-05-23 VITALS — BP 128/84 | HR 88 | Ht 70.0 in | Wt 217.0 lb

## 2018-05-23 DIAGNOSIS — N201 Calculus of ureter: Secondary | ICD-10-CM

## 2018-05-23 NOTE — Progress Notes (Signed)
   05/23/2018 12:20 PM   Delfino Lovett Darla Lesches 1950-09-06 [id number]  Reason for visit: Follow up right ureteroscopy, laser lithotripsy, stent placement, review ultrasound results  HPI: I saw Mr. Cogbill in urology clinic today in follow-up.  He is a 68 year old man with multiple comorbidities including CKD with baseline creatinine of approximately 2.1, EGFR of 30 who presents to review his renal ultrasound results in follow-up after right ureteroscopy and laser lithotripsy on 04/06/2018 for a right 1.3 cm impacted UPJ stone.  He currently is doing very well and denies any symptoms of flank pain, gross hematuria, or difficulty voiding.  He recently collected a 24-hour urine in the setting of his recurrent nephrolithiasis, and results are still pending.  Renal ultrasound 05/15/2018 shows mild right hydronephrosis but no obstructive calculi.  There are nonobstructive known left stable renal calculi.  We reviewed his ultrasound results today.  With his large stone that was impacted, I am not surprised that he has some residual mild right hydronephrosis.  He is not having any symptoms at this time, and anticipate this will resolve in follow-up.  We again discussed stone prevention strategies including adequate hydration, minimizing salt in the diet, the relationship between calcium and oxalate in the diet, and avoiding high protein diet.  We will call with 24-hour urine results RTC 2 months with repeat renal ultrasound and BMP  A total of 15 minutes were spent face-to-face with the patient, greater than 50% was spent in patient education, counseling, and coordination of care regarding nephrolithiasis, ultrasound results, and stone prevention.   Billey Co, [facility] [address] [place], [place] [address] [phone]

## 2018-05-23 NOTE — Telephone Encounter (Signed)
Dr Diamantina Providence has ordered a BCG for Pt. Just FYI

## 2018-05-28 ENCOUNTER — Other Ambulatory Visit: Payer: Self-pay | Admitting: Urology

## 2018-05-31 ENCOUNTER — Telehealth: Payer: Self-pay | Admitting: Urology

## 2018-05-31 NOTE — Telephone Encounter (Signed)
I do not see where this is indicated please help clarify

## 2018-05-31 NOTE — Telephone Encounter (Signed)
Spoke with Rosann Auerbach and she said she is not sure where she got this information from.    Sharyn Lull

## 2018-06-04 ENCOUNTER — Telehealth: Payer: Self-pay | Admitting: Family Medicine

## 2018-06-04 NOTE — Telephone Encounter (Signed)
[facility] sent a fax stating that the urine was rejected because it was too old.  Please advise

## 2018-06-05 NOTE — Telephone Encounter (Signed)
Attempted to call patient-unable to leave message-mailbox full

## 2018-06-05 NOTE — Telephone Encounter (Signed)
Please let him know the lab was unable to run the 24 hour urine test because the urine was too old. I have not seen this before.  He can either repeat the 24 hour urine collection, or just continue current stone prevention strategies including increasing fluids with goal urine output of more than 2.5L/day, minimizing salt in the diet, and avoiding foods with high oxalate like spinach, nuts, chocolate, and rhubarb.  Nickolas Madrid, MD 06/05/2018

## 2018-06-05 NOTE — Telephone Encounter (Signed)
Pt called back and I read the note from Dr Glori Luis to him, he states that he is not going to redo his 24 hr urine test. He is going to just modify his diet.

## 2018-06-18 DIAGNOSIS — I351 Nonrheumatic aortic (valve) insufficiency: Secondary | ICD-10-CM | POA: Insufficient documentation

## 2018-06-18 DIAGNOSIS — E785 Hyperlipidemia, unspecified: Secondary | ICD-10-CM | POA: Insufficient documentation

## 2018-06-18 DIAGNOSIS — Z720 Tobacco use: Secondary | ICD-10-CM | POA: Insufficient documentation

## 2018-07-23 ENCOUNTER — Ambulatory Visit: Payer: Medicare Other | Admitting: Urology

## 2018-09-11 ENCOUNTER — Ambulatory Visit: Payer: Medicare Other

## 2018-09-17 ENCOUNTER — Ambulatory Visit: Payer: Medicare Other | Admitting: Urology

## 2018-10-09 ENCOUNTER — Other Ambulatory Visit: Payer: Self-pay | Admitting: *Deleted

## 2018-10-09 DIAGNOSIS — Z87898 Personal history of other specified conditions: Secondary | ICD-10-CM

## 2018-10-10 ENCOUNTER — Other Ambulatory Visit: Payer: Medicare Other

## 2018-10-10 ENCOUNTER — Other Ambulatory Visit: Payer: Self-pay

## 2018-10-10 DIAGNOSIS — Z87898 Personal history of other specified conditions: Secondary | ICD-10-CM

## 2018-10-11 LAB — PSA: Prostate Specific Ag, Serum: 3.4 ng/mL (ref 0.0–4.0)

## 2018-10-12 ENCOUNTER — Ambulatory Visit: Payer: Medicare Other | Admitting: Urology

## 2018-10-12 ENCOUNTER — Encounter: Payer: Self-pay | Admitting: Urology

## 2018-10-12 ENCOUNTER — Other Ambulatory Visit: Payer: Self-pay

## 2018-10-12 DIAGNOSIS — N200 Calculus of kidney: Secondary | ICD-10-CM

## 2018-10-12 DIAGNOSIS — Z87898 Personal history of other specified conditions: Secondary | ICD-10-CM

## 2018-10-12 DIAGNOSIS — N401 Enlarged prostate with lower urinary tract symptoms: Secondary | ICD-10-CM | POA: Insufficient documentation

## 2018-10-12 NOTE — Progress Notes (Signed)
10/12/2018 9:14 AM   Christian Cline [birth year] [id number]  Referring provider: Renee Rival, NP [place],  [place] [address]  Chief Complaint  Patient presents with  . Elevated PSA    Urologic history:  1.  BPH with lower urinary tract symptoms  -On tamsulosin  2.  History elevated PSA  -12.2 03/2017; repeat PSAs have remained at baseline  3.  Nephrolithiasis  -Ureteroscopic removal 13 mm right UPJ stone 03/2018  -Nonobstructing left renal calculi   HPI: 68 y.o. male was scheduled for an annual follow-up for history of an elevated PSA.  He had had a PSA that was 12 in January 2019 however repeat returned to baseline in the mid 3 range.  I saw him in July 2019 however he presented in early January 2020 with right renal colic and underwent an attempt at right ureteroscopic removal however upper tract could not be accessed due to anatomy and a stent was placed for passive dilation.  Follow-up ureteroscopy late January 2020 was successful.  He last saw Dr. Diamantina Providence and March 2020 and a renal ultrasound showed mild right hydronephrosis but no calculi.  He has left renal calculi.  A 56-month follow-up renal ultrasound and basic metabolic panel was recommended.  This was ordered but not performed.  He did have a PSA drawn on 10/10/2018 which was stable at 3.4.  He denies bothersome lower urinary tract symptoms or gross hematuria.  Denies flank, abdominal, pelvic or scrotal pain.   PMH: Past Medical History:  Diagnosis Date  . Acid reflux   . Arthritis   . COPD (chronic obstructive pulmonary disease) ([place])   . Gout   . Heart murmur   . History of kidney stones   . Hyperlipidemia   . Hypertension   . Sleep apnea     Surgical History: Past Surgical History:  Procedure Laterality Date  . COLONOSCOPY WITH PROPOFOL    . CYSTOSCOPY W/ RETROGRADES Right 03/23/2018   Procedure: CYSTOSCOPY WITH RETROGRADE PYELOGRAM;  Surgeon: Billey Co, MD;  Location: [facility]  ORS;  Service: Urology;  Laterality: Right;  . CYSTOSCOPY WITH STENT PLACEMENT Right 03/23/2018   Procedure: CYSTOSCOPY WITH STENT PLACEMENT;  Surgeon: Billey Co, MD;  Location: [facility] ORS;  Service: Urology;  Laterality: Right;  . CYSTOSCOPY/URETEROSCOPY/HOLMIUM LASER/STENT PLACEMENT Right 04/06/2018   Procedure: CYSTOSCOPY/URETEROSCOPY/HOLMIUM LASER/STENT Exchange;  Surgeon: Billey Co, MD;  Location: [facility] ORS;  Service: Urology;  Laterality: Right;  . KIDNEY STONE SURGERY    . LASIK Bilateral   . SHOULDER ARTHROSCOPY      Home Medications:  Allergies as of 10/12/2018      Reactions   Ciprofloxacin    Causes severe dizziness.      Medication List       Accurate as of October 12, 2018  9:14 AM. If you have any questions, ask your nurse or doctor.        albuterol 108 (90 Base) MCG/ACT inhaler Commonly known as: VENTOLIN HFA once as needed   allopurinol 300 MG tablet Commonly known as: ZYLOPRIM Take 150 mg by mouth daily.   aspirin EC 81 MG tablet Take 81 mg by mouth daily.   atorvastatin 40 MG tablet Commonly known as: LIPITOR Take 40 mg by mouth daily.   chlorthalidone 25 MG tablet Commonly known as: HYGROTON Take 25 mg by mouth every morning.   Combivent Respimat 20-100 MCG/ACT Aers respimat Generic drug: Ipratropium-Albuterol Inhale 1 puff into the lungs every 6 (six)  hours as needed for wheezing or shortness of breath.   docusate sodium 100 MG capsule Commonly known as: COLACE Take 200 mg by mouth 2 (two) times daily.   Eliquis 5 MG Tabs tablet Generic drug: apixaban   famotidine 20 MG tablet Commonly known as: PEPCID Take 20 mg by mouth 2 (two) times daily.   Fluticasone-Salmeterol 250-50 MCG/DOSE Aepb Commonly known as: Advair Diskus Inhale 1 puff into the lungs 2 (two) times daily.   HYDROmorphone 2 MG tablet Commonly known as: Dilaudid Take 1 tablet (2 mg total) by mouth every 6 (six) hours as needed for severe pain.   meclizine 25 MG  tablet Commonly known as: ANTIVERT Take 25 mg by mouth 3 (three) times daily as needed for dizziness.   ondansetron 4 MG disintegrating tablet Commonly known as: Zofran ODT Take 1 tablet (4 mg total) by mouth every 8 (eight) hours as needed for nausea or vomiting.   quinapril 40 MG tablet Commonly known as: ACCUPRIL Take 40 mg by mouth daily.   tamsulosin 0.4 MG Caps capsule Commonly known as: FLOMAX Take 1 capsule (0.4 mg total) by mouth daily.   vardenafil 20 MG tablet Commonly known as: LEVITRA 1 tab 60 minutes prior to intercourse   verapamil 360 MG 24 hr capsule Commonly known as: VERELAN PM Take 360 mg by mouth at bedtime.       Allergies:  Allergies  Allergen Reactions  . Ciprofloxacin     Causes severe dizziness.    Family History: Family History  Problem Relation Age of Onset  . Kidney cancer Brother   . Kidney disease Neg Hx   . Prostate cancer Neg Hx     Social History:  reports that he has been smoking cigarettes. He has been smoking about 1.50 packs per day. He has never used smokeless tobacco. He reports current alcohol use. He reports that he does not use drugs.  ROS: UROLOGY Frequent Urination?: Yes Hard to postpone urination?: No Burning/pain with urination?: No Get up at night to urinate?: Yes Leakage of urine?: No Urine stream starts and stops?: No Trouble starting stream?: No Do you have to strain to urinate?: No Blood in urine?: No Urinary tract infection?: No Sexually transmitted disease?: No Injury to kidneys or bladder?: No Painful intercourse?: No Weak stream?: No Erection problems?: Yes Penile pain?: No  Gastrointestinal Nausea?: No Vomiting?: No Indigestion/heartburn?: No Diarrhea?: No Constipation?: No  Constitutional Fever: No Night sweats?: No Weight loss?: No Fatigue?: No  Skin Skin rash/lesions?: No Itching?: No  Eyes Blurred vision?: No Double vision?: No  Ears/Nose/Throat Sore throat?: No Sinus  problems?: No  Hematologic/Lymphatic Swollen glands?: No Easy bruising?: Yes  Cardiovascular Leg swelling?: No Chest pain?: No  Respiratory Cough?: No Shortness of breath?: Yes  Endocrine Excessive thirst?: No  Musculoskeletal Back pain?: No Joint pain?: No  Neurological Headaches?: No Dizziness?: No  Psychologic Depression?: No Anxiety?: No  Physical Exam: BP 123/68 (BP Location: Left Arm, Patient Position: Sitting, Cuff Size: Normal)   Pulse 79   Ht 5\' 10"  (1.778 m)   Wt 216 lb 8 oz (98.2 kg)   BMI 31.06 kg/m   Constitutional:  Alert and oriented, No acute distress. HEENT: [place] AT, moist mucus membranes.  Trachea midline, no masses. Cardiovascular: No clubbing, cyanosis, or edema. Respiratory: Normal respiratory effort, no increased work of breathing. GI: Abdomen is soft, nontender, nondistended, no abdominal masses GU: No CVA tenderness Lymph: No cervical or inguinal lymphadenopathy. Skin: No rashes, bruises or  suspicious lesions. Neurologic: Grossly intact, no focal deficits, moving all 4 extremities. Psychiatric: Normal mood and affect.     Assessment & Plan:    - History elevated PSA PSA is stable and at baseline  - Status post right ureteroscopic stone removal Renal ultrasound to be scheduled  - History acute kidney injury Repeat basic metabolic panel  Continue annual follow-up   [name], MD  [facility] [address], [place], [place] [address] [phone]

## 2018-10-18 LAB — BASIC METABOLIC PANEL
BUN/Creatinine Ratio: 8 — ABNORMAL LOW (ref 10–24)
BUN: 23 mg/dL (ref 8–27)
CO2: 18 mmol/L — ABNORMAL LOW (ref 20–29)
Calcium: 9.3 mg/dL (ref 8.6–10.2)
Chloride: 105 mmol/L (ref 96–106)
Creatinine, Ser: 2.9 mg/dL — ABNORMAL HIGH (ref 0.76–1.27)
GFR calc Af Amer: 25 mL/min/{1.73_m2} — ABNORMAL LOW (ref >59)
GFR calc non Af Amer: 21 mL/min/{1.73_m2} — ABNORMAL LOW (ref >59)
Glucose: 106 mg/dL — ABNORMAL HIGH (ref 65–99)
Potassium: 4.3 mmol/L (ref 3.5–5.2)
Sodium: 142 mmol/L (ref 134–144)

## 2018-10-18 LAB — SPECIMEN STATUS REPORT

## 2018-10-19 ENCOUNTER — Telehealth: Payer: Self-pay | Admitting: Urology

## 2018-10-19 NOTE — Telephone Encounter (Signed)
The patient no showed for his [place]

## 2018-10-19 NOTE — Telephone Encounter (Signed)
Patient notified, he states he was not notified of the renal u/s in June. He is off work all next week and would like it scheduled in the early Am if possible

## 2018-10-19 NOTE — Telephone Encounter (Signed)
-----   Message from [name], MD sent at 10/19/2018  1:09 PM EDT ----- Creatinine is still elevated above baseline.  Please make sure the renal ultrasound Dr. Diamantina Providence ordered gets scheduled

## 2018-10-22 NOTE — Telephone Encounter (Signed)
He was scheduled for his RUS on 09-11-18 and no showed, I can have Manuela Schwartz call to reschd it.   Thanks, Sharyn Lull

## 2018-10-25 ENCOUNTER — Ambulatory Visit
Admission: RE | Admit: 2018-10-25 | Discharge: 2018-10-25 | Disposition: A | Discharge status: Home / Self Care | Payer: Medicare Other | Source: Ambulatory Visit | Attending: Urology | Admitting: Urology

## 2018-10-25 ENCOUNTER — Other Ambulatory Visit: Payer: Self-pay

## 2018-10-25 DIAGNOSIS — N201 Calculus of ureter: Secondary | ICD-10-CM

## 2018-10-30 ENCOUNTER — Telehealth: Payer: Self-pay

## 2018-10-30 NOTE — Telephone Encounter (Signed)
Left mess to call 

## 2018-10-30 NOTE — Telephone Encounter (Signed)
-----   Message from Billey Co, MD sent at 10/29/2018  4:44 PM EDT ----- His renal ultrasound shows worsening blockage on the right side. Needs a stat CT renal stone protocol this week to evaluate for recurrent kidney stones v scar tissue.  Thanks Nickolas Madrid, MD 10/29/2018

## 2018-10-31 ENCOUNTER — Other Ambulatory Visit: Payer: Self-pay | Admitting: Urology

## 2018-10-31 DIAGNOSIS — N1339 Other hydronephrosis: Secondary | ICD-10-CM

## 2018-11-01 ENCOUNTER — Other Ambulatory Visit: Payer: Self-pay | Admitting: Family Medicine

## 2018-11-01 DIAGNOSIS — R109 Unspecified abdominal pain: Secondary | ICD-10-CM

## 2018-11-01 NOTE — Telephone Encounter (Signed)
Patient notified and Order is in.

## 2018-11-05 ENCOUNTER — Ambulatory Visit
Admission: RE | Admit: 2018-11-05 | Discharge: 2018-11-05 | Disposition: A | Discharge status: Home / Self Care | Payer: Medicare Other | Source: Ambulatory Visit | Attending: Urology | Admitting: Urology

## 2018-11-05 ENCOUNTER — Other Ambulatory Visit: Payer: Self-pay

## 2018-11-05 DIAGNOSIS — N1339 Other hydronephrosis: Secondary | ICD-10-CM

## 2018-11-07 ENCOUNTER — Telehealth: Payer: Self-pay | Admitting: Urology

## 2018-11-07 NOTE — Telephone Encounter (Addendum)
Urology telephone note  I contacted Christian Cline by phone today to discuss his recent CT results.  To briefly review, he is a 68 year old male who initially presented in early 2020 with multiple ER visits and right-sided flank pain secondary to a 1.3 cm UPJ stone.  He underwent staged ureteroscopy for inability to access the collecting system, and on 04/06/18 he underwent uncomplicated ureteroscopy and laser dusting of his 1.3 cm UPJ stone.  He had 2 stones in the lower pole at that time, however secondary to the acute angle, I was unable to reach these with the flexible ureteroscope and these were left behind.  On his standard follow-up renal ultrasound in March 2020 to evaluate for silent hydronephrosis there was mild right hydronephrosis seen.  This was thought to represent resolving hydro-from his severely impacted stone.  Secondary to the COVID-19 pandemic, a repeat ultrasound was not performed until August 2020 which showed severe right hydronephrosis.  Follow-up CT scan on 11/05/2018 showed an 8 mm distal ureteral stone with upstream hydronephrosis.  This stone appears to have migrated from the lower pole on his prior CT scan.  He denies any symptoms of flank pain, nausea/vomiting, gross hematuria, fevers, or chills.  He has baseline CKD, and most recent creatinine is 2.9, EGFR 21 on 10/10/2018.  We discussed treatment options including medical expulsive therapy, ureteroscopy laser lithotripsy and stent, or shockwave lithotripsy.  We discussed the risks and benefits at length.  I strongly recommended right ureteroscopy, laser lithotripsy, and stent in the next week in the setting of his CKD and worsen renal function.  He does not think he can get off of work this week, but is amenable to scheduling ureteroscopy next week.  Schedule right ureteroscopy, laser lithotripsy, stent placement  Nickolas Madrid, MD 11/07/2018

## 2018-11-09 ENCOUNTER — Telehealth: Payer: Self-pay | Admitting: Radiology

## 2018-11-09 ENCOUNTER — Other Ambulatory Visit: Payer: Self-pay | Admitting: Radiology

## 2018-11-09 DIAGNOSIS — N201 Calculus of ureter: Secondary | ICD-10-CM

## 2018-11-09 DIAGNOSIS — Z01818 Encounter for other preprocedural examination: Secondary | ICD-10-CM

## 2018-11-09 NOTE — Telephone Encounter (Signed)
Discussed the [facility] Information form below over the phone with patient.    Christian Cline, [place], [place] [address] Telephone: [phone] Fax: [phone]   Thank you for choosing [facility] for your upcoming surgery!  We are always here to assist in your urological needs.  Please read the following information with specific details for your upcoming appointments related to your surgery. Please contact Jene Oravec at [phone] Option 3 with any questions.  The Name of Your Surgery: Right ureteroscopy, laser lithotripsy, stone removal, ureteral stent placement Your Surgery Date: 11/23/2018 Your Surgeon: Nickolas Madrid  Please call Same Day Surgery at [phone] between the hours of 1pm-3pm one day prior to your surgery. They will inform you of the time to arrive at Same Day Surgery which is located on the second floor of the [facility].   Please refer to the attached letter regarding instructions for Pre-Admission Testing. You will receive a call from the [place] office regarding your appointment with them.  The Pre-Admission Testing office is located at [place], on the first floor of the [place] [facility] in [place] (office is to the right as you enter through the [employer] of the [facility]). Please have all medications you are currently taking and your insurance card available.  A COVID-19 test will be required prior to surgery and once test is performed you will need to remain in quarantine until the day of surgery. Patient was advised to have nothing to eat or drink after midnight the night prior to surgery except that he may have only water until 2 hours before surgery with nothing to drink within 2 hours of surgery.  The patient states he currently takes Eliquis & will be informed when to  stop medication once cleared by cardiologist.. Patient's questions were answered and he expressed understanding of these instructions.

## 2018-11-13 ENCOUNTER — Other Ambulatory Visit: Payer: Medicare Other

## 2018-11-13 ENCOUNTER — Other Ambulatory Visit: Payer: Self-pay

## 2018-11-13 DIAGNOSIS — N201 Calculus of ureter: Secondary | ICD-10-CM

## 2018-11-13 DIAGNOSIS — Z01818 Encounter for other preprocedural examination: Secondary | ICD-10-CM

## 2018-11-13 LAB — URINALYSIS, COMPLETE
Bilirubin, UA: NEGATIVE
Glucose, UA: NEGATIVE
Ketones, UA: NEGATIVE
Leukocytes,UA: NEGATIVE
Nitrite, UA: NEGATIVE
Protein,UA: NEGATIVE
Specific Gravity, UA: 1.02 (ref 1.005–1.030)
Urobilinogen, Ur: 1 mg/dL (ref 0.2–1.0)
pH, UA: 5.5 (ref 5.0–7.5)

## 2018-11-13 LAB — MICROSCOPIC EXAMINATION: Bacteria, UA: NONE SEEN

## 2018-11-16 ENCOUNTER — Other Ambulatory Visit: Payer: Medicare Other

## 2018-11-16 LAB — CULTURE, URINE COMPREHENSIVE

## 2018-11-16 NOTE — Telephone Encounter (Signed)
LMOM advising patient to hold Eliquis prior to surgery beginning 11/21/2018 per clearance from Dr Ubaldo Glassing.

## 2018-11-20 ENCOUNTER — Encounter
Admission: RE | Admit: 2018-11-20 | Discharge: 2018-11-20 | Disposition: A | Discharge status: Home / Self Care | Payer: Medicare Other | Source: Ambulatory Visit | Attending: Urology | Admitting: Urology

## 2018-11-20 ENCOUNTER — Other Ambulatory Visit
Admission: RE | Admit: 2018-11-20 | Discharge: 2018-11-20 | Disposition: A | Discharge status: Home / Self Care | Payer: Medicare Other | Source: Ambulatory Visit | Attending: Urology | Admitting: Urology

## 2018-11-20 ENCOUNTER — Other Ambulatory Visit: Payer: Self-pay

## 2018-11-20 DIAGNOSIS — N189 Chronic kidney disease, unspecified: Secondary | ICD-10-CM | POA: Diagnosis not present

## 2018-11-20 DIAGNOSIS — E785 Hyperlipidemia, unspecified: Secondary | ICD-10-CM | POA: Diagnosis not present

## 2018-11-20 DIAGNOSIS — Z7901 Long term (current) use of anticoagulants: Secondary | ICD-10-CM | POA: Diagnosis not present

## 2018-11-20 DIAGNOSIS — G4730 Sleep apnea, unspecified: Secondary | ICD-10-CM | POA: Diagnosis not present

## 2018-11-20 DIAGNOSIS — Z79899 Other long term (current) drug therapy: Secondary | ICD-10-CM | POA: Diagnosis not present

## 2018-11-20 DIAGNOSIS — N132 Hydronephrosis with renal and ureteral calculous obstruction: Secondary | ICD-10-CM | POA: Diagnosis present

## 2018-11-20 DIAGNOSIS — R9431 Abnormal electrocardiogram [ECG] [EKG]: Secondary | ICD-10-CM | POA: Insufficient documentation

## 2018-11-20 DIAGNOSIS — I129 Hypertensive chronic kidney disease with stage 1 through stage 4 chronic kidney disease, or unspecified chronic kidney disease: Secondary | ICD-10-CM | POA: Diagnosis not present

## 2018-11-20 DIAGNOSIS — Z01818 Encounter for other preprocedural examination: Secondary | ICD-10-CM | POA: Insufficient documentation

## 2018-11-20 DIAGNOSIS — I4891 Unspecified atrial fibrillation: Secondary | ICD-10-CM | POA: Diagnosis not present

## 2018-11-20 DIAGNOSIS — I4510 Unspecified right bundle-branch block: Secondary | ICD-10-CM | POA: Insufficient documentation

## 2018-11-20 DIAGNOSIS — F17210 Nicotine dependence, cigarettes, uncomplicated: Secondary | ICD-10-CM | POA: Diagnosis not present

## 2018-11-20 DIAGNOSIS — M109 Gout, unspecified: Secondary | ICD-10-CM | POA: Diagnosis not present

## 2018-11-20 DIAGNOSIS — Z20828 Contact with and (suspected) exposure to other viral communicable diseases: Secondary | ICD-10-CM | POA: Insufficient documentation

## 2018-11-20 DIAGNOSIS — K219 Gastro-esophageal reflux disease without esophagitis: Secondary | ICD-10-CM | POA: Diagnosis not present

## 2018-11-20 DIAGNOSIS — Z87442 Personal history of urinary calculi: Secondary | ICD-10-CM | POA: Diagnosis not present

## 2018-11-20 DIAGNOSIS — J449 Chronic obstructive pulmonary disease, unspecified: Secondary | ICD-10-CM | POA: Diagnosis not present

## 2018-11-20 DIAGNOSIS — N201 Calculus of ureter: Secondary | ICD-10-CM | POA: Insufficient documentation

## 2018-11-20 DIAGNOSIS — N210 Calculus in bladder: Secondary | ICD-10-CM | POA: Diagnosis not present

## 2018-11-20 DIAGNOSIS — I10 Essential (primary) hypertension: Secondary | ICD-10-CM | POA: Insufficient documentation

## 2018-11-20 DIAGNOSIS — I444 Left anterior fascicular block: Secondary | ICD-10-CM | POA: Insufficient documentation

## 2018-11-20 DIAGNOSIS — Z7982 Long term (current) use of aspirin: Secondary | ICD-10-CM | POA: Diagnosis not present

## 2018-11-20 HISTORY — DX: Bell's palsy: G51.0

## 2018-11-20 LAB — BASIC METABOLIC PANEL
Anion gap: 10 (ref 5–15)
BUN: 34 mg/dL — ABNORMAL HIGH (ref 8–23)
CO2: 23 mmol/L (ref 22–32)
Calcium: 9.5 mg/dL (ref 8.9–10.3)
Chloride: 108 mmol/L (ref 98–111)
Creatinine, Ser: 3.21 mg/dL — ABNORMAL HIGH (ref 0.61–1.24)
GFR calc Af Amer: 22 mL/min — ABNORMAL LOW (ref >60)
GFR calc non Af Amer: 19 mL/min — ABNORMAL LOW (ref >60)
Glucose, Bld: 73 mg/dL (ref 70–99)
Potassium: 4.1 mmol/L (ref 3.5–5.1)
Sodium: 141 mmol/L (ref 135–145)

## 2018-11-20 LAB — CBC
HCT: 46 % (ref 39.0–52.0)
Hemoglobin: 15 g/dL (ref 13.0–17.0)
MCH: 31.1 pg (ref 26.0–34.0)
MCHC: 32.6 g/dL (ref 30.0–36.0)
MCV: 95.2 fL (ref 80.0–100.0)
Platelets: 259 10*3/uL (ref 150–400)
RBC: 4.83 MIL/uL (ref 4.22–5.81)
RDW: 14.9 % (ref 11.5–15.5)
WBC: 12.1 10*3/uL — ABNORMAL HIGH (ref 4.0–10.5)
nRBC: 0 % (ref 0.0–0.2)

## 2018-11-20 LAB — SARS CORONAVIRUS 2 (TAT 6-24 HRS): SARS Coronavirus 2: NEGATIVE

## 2018-11-20 NOTE — Patient Instructions (Signed)
Your procedure is scheduled on: November 23, 2018 Friday  Report to Day Surgery on the 2nd floor of the [facility]. To find out your arrival time, please call [phone] between 1PM - 3PM on: November 22, 2018 THURSDAY  REMEMBER: Instructions that are not followed completely may result in serious medical risk, up to and including death; or upon the discretion of your surgeon and anesthesiologist your surgery may need to be rescheduled.  Do not eat food after midnight the night before surgery.  No gum chewing, lozengers or hard candies.  You may however, drink CLEAR liquids up to 2 hours before you are scheduled to arrive for your surgery. Do not drink anything within 2 hours of the start of your surgery.  Clear liquids include: - water  - apple juice without pulp -CLEAR  gatorade - black coffee or tea (Do NOT add milk or creamers to the coffee or tea) Do NOT drink anything that is not on this list.  Type 1 and Type 2 diabetics should only drink water.  No Alcohol for 24 hours before or after surgery.  No Smoking including e-cigarettes for 24 hours prior to surgery.  No chewable tobacco products for at least 6 hours prior to surgery.  No nicotine patches on the day of surgery.  On the morning of surgery brush your teeth with toothpaste and water, you may rinse your mouth with mouthwash if you wish. Do not swallow any toothpaste or mouthwash.  Notify your doctor if there is any change in your medical condition (cold, fever, infection).  Do not wear jewelry, make-up, hairpins, clips or nail polish.  Do not wear lotions, powders, or perfumes.   Do not shave 48 hours prior to surgery.   Contacts and dentures may not be worn into surgery.  Do not bring valuables to the hospital, including drivers license, insurance or credit cards.  [facility] is not responsible for any belongings or valuables.   TAKE THESE MEDICATIONS THE MORNING OF  SURGERY: ALLOPURINOL ATORVASTATIN HYGROTON PEPCID (take one the night before and one on the morning of surgery - helps to prevent nausea after surgery.)  DO NOT TAKE QUINAPRIL DAY OF SURGERY.  TAKE A SHOWER   Use inhalers on the day of surgery    Follow recommendations from Cardiologist, Pulmonologist or PCP regarding stopping Aspirin,Eliquis STOP ELIQUIS AFTER TODAY'S DOSE PER AMY  Stop Anti-inflammatories (NSAIDS) such as Advil, Aleve, Ibuprofen, Motrin, Naproxen, Naprosyn and Aspirin based products such as Excedrin, Goodys Powder, BC Powder. (May take Tylenol or Acetaminophen if needed.)  Stop ANY OVER THE COUNTER supplements until after surgery. (May continue Vitamin D, Vitamin B, and multivitamin.)  Wear comfortable clothing (specific to your surgery type) to the hospital.  Plan for stool softeners for home use.  If you are being discharged the day of surgery, you will not be allowed to drive home. You will need a responsible adult to drive you home and stay with you that night.   If you are taking public transportation, you will need to have a responsible adult with you. Please confirm with your physician that it is acceptable to use public transportation.   Please call [phone] if you have any questions about these instructions.

## 2018-11-20 NOTE — Telephone Encounter (Signed)
LMOM advising patient to hold Eliquis prior to surgery beginning 11/21/2018 per clearance from Dr Ubaldo Glassing. Also requested Katie at pre-admission testing office to notify patient at his appointment today.

## 2018-11-23 ENCOUNTER — Encounter: Payer: Self-pay | Admitting: *Deleted

## 2018-11-23 ENCOUNTER — Encounter
Admission: RE | Disposition: A | Discharge status: Home / Self Care | Payer: Self-pay | Source: Home / Self Care | Attending: Urology

## 2018-11-23 ENCOUNTER — Ambulatory Visit: Payer: Medicare Other | Admitting: Certified Registered Nurse Anesthetist

## 2018-11-23 ENCOUNTER — Ambulatory Visit: Payer: Medicare Other

## 2018-11-23 ENCOUNTER — Ambulatory Visit
Admission: RE | Admit: 2018-11-23 | Discharge: 2018-11-23 | Disposition: A | Discharge status: Home / Self Care | Payer: Medicare Other | Attending: Urology | Admitting: Urology

## 2018-11-23 DIAGNOSIS — Z79899 Other long term (current) drug therapy: Secondary | ICD-10-CM | POA: Insufficient documentation

## 2018-11-23 DIAGNOSIS — K219 Gastro-esophageal reflux disease without esophagitis: Secondary | ICD-10-CM | POA: Insufficient documentation

## 2018-11-23 DIAGNOSIS — N201 Calculus of ureter: Secondary | ICD-10-CM

## 2018-11-23 DIAGNOSIS — N132 Hydronephrosis with renal and ureteral calculous obstruction: Secondary | ICD-10-CM | POA: Diagnosis not present

## 2018-11-23 DIAGNOSIS — N189 Chronic kidney disease, unspecified: Secondary | ICD-10-CM | POA: Insufficient documentation

## 2018-11-23 DIAGNOSIS — I4891 Unspecified atrial fibrillation: Secondary | ICD-10-CM | POA: Insufficient documentation

## 2018-11-23 DIAGNOSIS — M109 Gout, unspecified: Secondary | ICD-10-CM | POA: Insufficient documentation

## 2018-11-23 DIAGNOSIS — G4730 Sleep apnea, unspecified: Secondary | ICD-10-CM | POA: Insufficient documentation

## 2018-11-23 DIAGNOSIS — F17210 Nicotine dependence, cigarettes, uncomplicated: Secondary | ICD-10-CM | POA: Insufficient documentation

## 2018-11-23 DIAGNOSIS — Z87442 Personal history of urinary calculi: Secondary | ICD-10-CM | POA: Insufficient documentation

## 2018-11-23 DIAGNOSIS — Z7982 Long term (current) use of aspirin: Secondary | ICD-10-CM | POA: Insufficient documentation

## 2018-11-23 DIAGNOSIS — I129 Hypertensive chronic kidney disease with stage 1 through stage 4 chronic kidney disease, or unspecified chronic kidney disease: Secondary | ICD-10-CM | POA: Insufficient documentation

## 2018-11-23 DIAGNOSIS — J449 Chronic obstructive pulmonary disease, unspecified: Secondary | ICD-10-CM | POA: Insufficient documentation

## 2018-11-23 DIAGNOSIS — N210 Calculus in bladder: Secondary | ICD-10-CM | POA: Insufficient documentation

## 2018-11-23 DIAGNOSIS — Z20828 Contact with and (suspected) exposure to other viral communicable diseases: Secondary | ICD-10-CM | POA: Insufficient documentation

## 2018-11-23 DIAGNOSIS — Z7901 Long term (current) use of anticoagulants: Secondary | ICD-10-CM | POA: Insufficient documentation

## 2018-11-23 DIAGNOSIS — E785 Hyperlipidemia, unspecified: Secondary | ICD-10-CM | POA: Insufficient documentation

## 2018-11-23 HISTORY — PX: CYSTOSCOPY/URETEROSCOPY/HOLMIUM LASER/STENT PLACEMENT: SHX6546

## 2018-11-23 HISTORY — PX: CYSTOSCOPY W/ RETROGRADES: SHX1426

## 2018-11-23 HISTORY — PX: CYSTOSCOPY WITH URETHRAL DILATATION: SHX5125

## 2018-11-23 SURGERY — CYSTOSCOPY/URETEROSCOPY/HOLMIUM LASER/STENT PLACEMENT
Anesthesia: General | Site: Urethra | Laterality: Right

## 2018-11-23 MED ORDER — PROPOFOL 10 MG/ML IV BOLUS
INTRAVENOUS | Status: AC
  Filled 2018-11-23: qty 20

## 2018-11-23 MED ORDER — MIDAZOLAM HCL 2 MG/2ML IJ SOLN
INTRAMUSCULAR | Status: DC | PRN
  Administered 2018-11-23: 2 mg via INTRAVENOUS

## 2018-11-23 MED ORDER — KETOROLAC TROMETHAMINE 30 MG/ML IJ SOLN
INTRAMUSCULAR | Status: AC
  Filled 2018-11-23: qty 1

## 2018-11-23 MED ORDER — ROCURONIUM BROMIDE 50 MG/5ML IV SOLN
INTRAVENOUS | Status: AC
  Filled 2018-11-23: qty 1

## 2018-11-23 MED ORDER — MIDAZOLAM HCL 2 MG/2ML IJ SOLN
INTRAMUSCULAR | Status: AC
  Filled 2018-11-23: qty 2

## 2018-11-23 MED ORDER — LIDOCAINE HCL (CARDIAC) PF 100 MG/5ML IV SOSY
PREFILLED_SYRINGE | INTRAVENOUS | Status: DC | PRN
  Administered 2018-11-23: 100 mg via INTRAVENOUS

## 2018-11-23 MED ORDER — HYDROCODONE-ACETAMINOPHEN 5-325 MG PO TABS: 1.0000 | ORAL_TABLET | ORAL | 0 refills | Status: AC | PRN

## 2018-11-23 MED ORDER — SUGAMMADEX SODIUM 200 MG/2ML IV SOLN
INTRAVENOUS | Status: DC | PRN
  Administered 2018-11-23: 200 mg via INTRAVENOUS

## 2018-11-23 MED ORDER — ONDANSETRON HCL 4 MG/2ML IJ SOLN: 4.0000 mg | Freq: Once | INTRAMUSCULAR | Status: DC | PRN

## 2018-11-23 MED ORDER — LIDOCAINE HCL (PF) 2 % IJ SOLN
INTRAMUSCULAR | Status: AC
  Filled 2018-11-23: qty 10

## 2018-11-23 MED ORDER — KETOROLAC TROMETHAMINE 15 MG/ML IJ SOLN
INTRAMUSCULAR | Status: DC | PRN
  Administered 2018-11-23: 15 mg via INTRAVENOUS

## 2018-11-23 MED ORDER — SUGAMMADEX SODIUM 200 MG/2ML IV SOLN
INTRAVENOUS | Status: AC
  Filled 2018-11-23: qty 2

## 2018-11-23 MED ORDER — BELLADONNA ALKALOIDS-OPIUM 16.2-60 MG RE SUPP
RECTAL | Status: AC
  Filled 2018-11-23: qty 1

## 2018-11-23 MED ORDER — LIDOCAINE HCL 4 % MT SOLN
OROMUCOSAL | Status: DC | PRN
  Administered 2018-11-23: 4 mL via TOPICAL

## 2018-11-23 MED ORDER — DEXAMETHASONE SODIUM PHOSPHATE 10 MG/ML IJ SOLN
INTRAMUSCULAR | Status: AC
  Filled 2018-11-23: qty 1

## 2018-11-23 MED ORDER — SULFAMETHOXAZOLE-TRIMETHOPRIM 800-160 MG PO TABS: 1.0000 | ORAL_TABLET | Freq: Every day | ORAL | 0 refills | Status: DC

## 2018-11-23 MED ORDER — ONDANSETRON HCL 4 MG/2ML IJ SOLN
INTRAMUSCULAR | Status: DC | PRN
  Administered 2018-11-23: 4 mg via INTRAVENOUS

## 2018-11-23 MED ORDER — CEFAZOLIN SODIUM-DEXTROSE 2-4 GM/100ML-% IV SOLN
INTRAVENOUS | Status: AC
  Filled 2018-11-23: qty 100

## 2018-11-23 MED ORDER — FENTANYL CITRATE (PF) 100 MCG/2ML IJ SOLN: 25.0000 ug | INTRAMUSCULAR | Status: DC | PRN

## 2018-11-23 MED ORDER — OXYBUTYNIN CHLORIDE ER 10 MG PO TB24: 10.0000 mg | ORAL_TABLET | Freq: Every day | ORAL | 0 refills | Status: AC | PRN

## 2018-11-23 MED ORDER — SUCCINYLCHOLINE CHLORIDE 20 MG/ML IJ SOLN
INTRAMUSCULAR | Status: DC | PRN
  Administered 2018-11-23: 100 mg via INTRAVENOUS

## 2018-11-23 MED ORDER — BELLADONNA ALKALOIDS-OPIUM 16.2-60 MG RE SUPP
RECTAL | Status: DC | PRN
  Administered 2018-11-23: 1 via RECTAL

## 2018-11-23 MED ORDER — ONDANSETRON HCL 4 MG/2ML IJ SOLN
INTRAMUSCULAR | Status: AC
  Filled 2018-11-23: qty 2

## 2018-11-23 MED ORDER — LACTATED RINGERS IV SOLN
INTRAVENOUS | Status: DC
  Administered 2018-11-23: 08:00:00 via INTRAVENOUS

## 2018-11-23 MED ORDER — IOHEXOL 180 MG/ML  SOLN
INTRAMUSCULAR | Status: DC | PRN
  Administered 2018-11-23: 20 mL

## 2018-11-23 MED ORDER — ROCURONIUM BROMIDE 100 MG/10ML IV SOLN
INTRAVENOUS | Status: DC | PRN
  Administered 2018-11-23: 20 mg via INTRAVENOUS
  Administered 2018-11-23: 5 mg via INTRAVENOUS
  Administered 2018-11-23: 45 mg via INTRAVENOUS

## 2018-11-23 MED ORDER — CEFAZOLIN SODIUM-DEXTROSE 2-4 GM/100ML-% IV SOLN
2.0000 g | INTRAVENOUS | Status: AC
  Administered 2018-11-23: 2 g via INTRAVENOUS

## 2018-11-23 MED ORDER — DEXAMETHASONE SODIUM PHOSPHATE 10 MG/ML IJ SOLN
INTRAMUSCULAR | Status: DC | PRN
  Administered 2018-11-23: 10 mg via INTRAVENOUS

## 2018-11-23 MED ORDER — SEVOFLURANE IN SOLN
RESPIRATORY_TRACT | Status: AC
  Filled 2018-11-23: qty 250

## 2018-11-23 MED ORDER — FENTANYL CITRATE (PF) 100 MCG/2ML IJ SOLN
INTRAMUSCULAR | Status: DC | PRN
  Administered 2018-11-23: 50 ug via INTRAVENOUS

## 2018-11-23 MED ORDER — SUCCINYLCHOLINE CHLORIDE 20 MG/ML IJ SOLN
INTRAMUSCULAR | Status: AC
  Filled 2018-11-23: qty 1

## 2018-11-23 MED ORDER — FENTANYL CITRATE (PF) 100 MCG/2ML IJ SOLN
INTRAMUSCULAR | Status: AC
  Filled 2018-11-23: qty 2

## 2018-11-23 MED ORDER — PROPOFOL 10 MG/ML IV BOLUS
INTRAVENOUS | Status: DC | PRN
  Administered 2018-11-23: 130 mg via INTRAVENOUS

## 2018-11-23 SURGICAL SUPPLY — 31 items
BAG DRAIN CYSTO-URO LG1000N (MISCELLANEOUS) ×5 IMPLANT
BRUSH SCRUB EZ 1% IODOPHOR (MISCELLANEOUS) ×5 IMPLANT
CATH URETL 5X70 OPEN END (CATHETERS) ×5 IMPLANT
CNTNR SPEC 2.5X3XGRAD LEK (MISCELLANEOUS)
CONT SPEC 4OZ STER OR WHT (MISCELLANEOUS)
CONTAINER SPEC 2.5X3XGRAD LEK (MISCELLANEOUS) IMPLANT
DRAPE UTILITY 15X26 TOWEL STRL (DRAPES) ×5 IMPLANT
FIBER LASER TRAC TIP (UROLOGICAL SUPPLIES) ×5 IMPLANT
GLOVE BIOGEL PI IND STRL 7.5 (GLOVE) ×3 IMPLANT
GLOVE BIOGEL PI INDICATOR 7.5 (GLOVE) ×2
GOWN STRL REUS W/ TWL LRG LVL3 (GOWN DISPOSABLE) ×3 IMPLANT
GOWN STRL REUS W/ TWL XL LVL3 (GOWN DISPOSABLE) ×3 IMPLANT
GOWN STRL REUS W/TWL LRG LVL3 (GOWN DISPOSABLE) ×2
GOWN STRL REUS W/TWL XL LVL3 (GOWN DISPOSABLE) ×2
GUIDEWIRE GREEN .038 145CM (MISCELLANEOUS) ×5 IMPLANT
GUIDEWIRE STR DUAL SENSOR (WIRE) ×10 IMPLANT
INFUSOR MANOMETER BAG 3000ML (MISCELLANEOUS) ×5 IMPLANT
INTRODUCER DILATOR DOUBLE (INTRODUCER) ×5 IMPLANT
KIT TURNOVER CYSTO (KITS) ×5 IMPLANT
PACK CYSTO AR (MISCELLANEOUS) ×5 IMPLANT
SET CYSTO W/LG BORE CLAMP LF (SET/KITS/TRAYS/PACK) ×5 IMPLANT
SHEATH URETERAL 12FRX35CM (MISCELLANEOUS) IMPLANT
SOL .9 NS 3000ML IRR  AL (IV SOLUTION) ×2
SOL .9 NS 3000ML IRR UROMATIC (IV SOLUTION) ×3 IMPLANT
STENT URET 6FRX24 CONTOUR (STENTS) IMPLANT
STENT URET 6FRX26 CONTOUR (STENTS) IMPLANT
STENT URET 6FRX28 CONTOUR (STENTS) ×5 IMPLANT
SURGILUBE 2OZ TUBE FLIPTOP (MISCELLANEOUS) ×5 IMPLANT
SYR 10ML LL (SYRINGE) ×5 IMPLANT
VALVE UROSEAL ADJ ENDO (VALVE) ×5 IMPLANT
WATER STERILE IRR 1000ML POUR (IV SOLUTION) ×5 IMPLANT

## 2018-11-23 NOTE — Anesthesia Preprocedure Evaluation (Signed)
Anesthesia Evaluation  Patient identified by MRN, date of birth, ID band Patient awake    Reviewed: Allergy & Precautions, H&P , NPO status , reviewed documented beta blocker date and time   History of Anesthesia Complications Negative for: history of anesthetic complications  Airway Mallampati: II  TM Distance: >3 FB Neck ROM: full    Dental  (+) Partial Upper, Partial Lower, Poor Dentition, Dental Advidsory Given   Pulmonary neg shortness of breath, sleep apnea , COPD,  COPD inhaler, neg recent URI, Current Smoker and Patient abstained from smoking.,    Pulmonary exam normal        Cardiovascular hypertension, (-) angina(-) CAD, (-) Past MI, (-) Cardiac Stents and (-) CABG Normal cardiovascular exam+ dysrhythmias Atrial Fibrillation + Valvular Problems/Murmurs (murmur)      Neuro/Psych negative neurological ROS  negative psych ROS   GI/Hepatic Neg liver ROS, GERD  Controlled and Medicated,  Endo/Other  negative endocrine ROS  Renal/GU Renal disease (kidney disease)     Musculoskeletal  (+) Arthritis ,   Abdominal   Peds  Hematology   Anesthesia Other Findings Past Medical History: No date: Acid reflux No date: Arthritis No date: COPD (chronic obstructive pulmonary disease) (HCC) No date: Gout No date: Heart murmur No date: History of kidney stones No date: Hyperlipidemia No date: Hypertension No date: Sleep apnea Past Surgical History: No date: COLONOSCOPY WITH PROPOFOL 03/23/2018: CYSTOSCOPY W/ RETROGRADES; Right     Comment:  Procedure: CYSTOSCOPY WITH RETROGRADE PYELOGRAM;                Surgeon: Billey Co, MD;  Location: [facility] ORS;                Service: Urology;  Laterality: Right; 03/23/2018: CYSTOSCOPY WITH STENT PLACEMENT; Right     Comment:  Procedure: CYSTOSCOPY WITH STENT PLACEMENT;  Surgeon:               Billey Co, MD;  Location: [facility] ORS;  Service:               Urology;   Laterality: Right; No date: KIDNEY STONE SURGERY No date: LASIK; Bilateral No date: SHOULDER ARTHROSCOPY BMI    Body Mass Index:  31.51 kg/m     Reproductive/Obstetrics                             Anesthesia Physical  Anesthesia Plan  ASA: III  Anesthesia Plan: General   Post-op Pain Management:    Induction: Intravenous  PONV Risk Score and Plan: 3 and Ondansetron, Treatment may vary due to age or medical condition, Dexamethasone and Midazolam  Airway Management Planned: Oral ETT  Additional Equipment:   Intra-op Plan:   Post-operative Plan: Extubation in OR  Informed Consent: I have reviewed the patients History and Physical, chart, labs and discussed the procedure including the risks, benefits and alternatives for the proposed anesthesia with the patient or authorized representative who has indicated his/her understanding and acceptance.     Dental Advisory Given  Plan Discussed with: CRNA  Anesthesia Plan Comments:         Anesthesia Quick Evaluation

## 2018-11-23 NOTE — Op Note (Signed)
Date of procedure: 11/23/18  Preoperative diagnosis:  1. Right 1 cm distal ureteral stone  Postoperative diagnosis:  1. Same, stone impacted  Procedure: 1. Cystoscopy, right ureteroscopy, right retrograde pyelogram with intraoperative interpretation, laser lithotripsy, right ureteral stent placement  Surgeon: Nickolas Madrid, MD  Anesthesia: General  Complications: None  Intraoperative findings:  1.  Normal cystoscopy, small bladder stone evacuated 2.  Impacted large 1 cm right distal ureteral stone at the level of the vessels, stone dusted in pieces irrigated free 3.  Flexible ureteroscopy on the right side showed no additional stones, though movement in the kidney was limited secondary to significant mid ureteral edema 4.  No extravasation on retrograde pyelogram  EBL: Minimal  Specimens: None  Drains: Right 6 French by 28 cm ureteral stent  Indication: Christian Cline is a 68 y.o. patient with recurrent nephrolithiasis and recently found to have a 1 cm right distal ureteral stone with upstream hydronephrosis on CT.  After reviewing the management options for treatment, they elected to proceed with the above surgical procedure(s). We have discussed the potential benefits and risks of the procedure, side effects of the proposed treatment, the likelihood of the patient achieving the goals of the procedure, and any potential problems that might occur during the procedure or recuperation. Informed consent has been obtained.  Description of procedure:  The patient was taken to the operating room and general anesthesia was induced. SCDs were placed for DVT prophylaxis. The patient was placed in the dorsal lithotomy position, prepped and draped in the usual sterile fashion, and preoperative antibiotics were administered. A preoperative time-out was performed.   The urethra was gently dilated with Leander Rams sounds to 53 [place] in the setting of his prior ureteroscopy with significant  urethral narrowing.  This then accommodated the 21 French rigid cystoscope easily.  The prostate was small.  The bladder mucosa was grossly normal.  A small 5 mm black stone was evacuated from the bladder.  A retrograde pyelogram was performed on the right side which showed a filling defect in the distal right ureter consistent with his known stone and upstream hydronephrosis.  With the aid of a 5 French access catheter, sensor wire was passed alongside the stone under fluoroscopic vision up to the renal pelvis.  A semirigid ureteroscope was then advanced alongside the wire and then impacted yellow and black stone was identified at the level of the vessels.  There was significant ureteral edema.  With a 200 m laser fiber on settings of 0.5 J and 20 Hz, the stone was carefully fragmented taking care to avoid ureteral mucosa.  Thorough inspection of the ureter revealed no residual fragments.  Retrograde pyelogram was performed through the scope from below the significant area of impaction, no extravasation of contrast was seen, and a dilated collecting system was identified upstream.  A second safety wire was added through the scope and pullback ureteroscopy demonstrated no residual fragments.  A flexible single-channel ureteroscope was attempted to pass over the wire, however met resistance at the UVJ.  The sensor wire was exchanged for a Super Stiff wire, and the single-channel ureteroscope then passed easily up to the kidney.  The mobility in the kidney was extremely limited, likely secondary to the severe distal ureteral edema.  The collecting system was extremely dilated, however inspection revealed no additional stones.  I was able to deflect into the lower pole, and did not appreciate any other stones.  Careful pullback ureteroscopy again demonstrated no ureteral injury or  other residual fragments.  Contrast was injected again from below the level where the stone was impacted and showed no extravasation.  A  rigid cystoscope was backloaded over the wire and a 6 [place] by 28 cm right ureteral stent was uneventfully placed.  There was an excellent curl in the upper pole, as well as under direct vision in the bladder.  A belladonna suppository was placed.  Disposition: Stable to PACU  Plan: Discharge home today, prophylactic Bactrim while stent in place Resume Eliquis in 3 days Follow-up for stent removal in 10 to 14 days Will need follow-up renal ultrasound in 4 weeks after stent removal  Nickolas Madrid, MD

## 2018-11-23 NOTE — Anesthesia Postprocedure Evaluation (Signed)
Anesthesia Post Note  Patient: OMID BRIGNER  Procedure(s) Performed: CYSTOSCOPY/URETEROSCOPY/HOLMIUM LASER/STENT PLACEMENT (Right ) CYSTOSCOPY WITH RETROGRADE PYELOGRAM (Right Ureter) CYSTOSCOPY WITH URETHRAL DILATATION (N/A Urethra)  Patient location during evaluation: PACU Anesthesia Type: General Level of consciousness: awake and alert Pain management: pain level controlled Vital Signs Assessment: post-procedure vital signs reviewed and stable Respiratory status: spontaneous breathing, nonlabored ventilation, respiratory function stable and patient connected to nasal cannula oxygen Cardiovascular status: blood pressure returned to baseline and stable Postop Assessment: no apparent nausea or vomiting Anesthetic complications: no     Last Vitals:  Vitals:   11/23/18 1030 11/23/18 1048  BP: 121/65 140/80  Pulse: 73 65  Resp: 18 20  Temp: (!) 36.2 C   SpO2: 95% 97%    Last Pain:  Vitals:   11/23/18 1048  TempSrc:   PainSc: 0-No pain                 Martha Clan

## 2018-11-23 NOTE — Discharge Instructions (Signed)

## 2018-11-23 NOTE — Anesthesia Post-op Follow-up Note (Signed)
Anesthesia QCDR form completed.        

## 2018-11-23 NOTE — Transfer of Care (Signed)
Immediate Anesthesia Transfer of Care Note  Patient: Christian Cline  Procedure(s) Performed: CYSTOSCOPY/URETEROSCOPY/HOLMIUM LASER/STENT PLACEMENT (Right ) CYSTOSCOPY WITH RETROGRADE PYELOGRAM (Right Ureter) CYSTOSCOPY WITH URETHRAL DILATATION (N/A Urethra)  Patient Location: PACU  Anesthesia Type:General  Level of Consciousness: drowsy  Airway & Oxygen Therapy: Patient Spontanous Breathing and Patient connected to face mask oxygen  Post-op Assessment: Report given to RN and Post -op Vital signs reviewed and stable  Post vital signs: Reviewed and stable  Last Vitals:  Vitals Value Taken Time  BP 123/77 11/23/18 0937  Temp 97.2   Pulse 74 11/23/18 0937  Resp 17 11/23/18 0937  SpO2 97 % 11/23/18 0937  Vitals shown include unvalidated device data.  Last Pain:  Vitals:   11/23/18 0937  PainSc: (P) Asleep         Complications: No apparent anesthesia complications

## 2018-11-23 NOTE — H&P (Signed)
11/23/18 7:51 AM   Delfino Lovett Darla Lesches 1950-03-31 [id number]  CC: Right 8 mm distal ureteral stone  HPI: Mr. Christian Cline is a 68 year old comorbid male with a long history of nephrolithiasis who was recently found to have a right 8 mm distal ureteral stone as well as a smaller lower pole renal stone.  He presents today for definitive management with ureteroscopy, laser lithotripsy, and stent placement.  He denies any fevers or chills, dysuria, or other urinary symptoms.  He denies any chest pain or shortness of breath.   PMH: Past Medical History:  Diagnosis Date  . Acid reflux   . Arthritis   . Bell's palsy   . COPD (chronic obstructive pulmonary disease) ([place])   . Gout   . Heart murmur   . History of kidney stones   . Hyperlipidemia   . Hypertension   . Sleep apnea     Surgical History: Past Surgical History:  Procedure Laterality Date  . COLONOSCOPY WITH PROPOFOL    . CYSTOSCOPY W/ RETROGRADES Right 03/23/2018   Procedure: CYSTOSCOPY WITH RETROGRADE PYELOGRAM;  Surgeon: Billey Co, MD;  Location: [facility] ORS;  Service: Urology;  Laterality: Right;  . CYSTOSCOPY WITH STENT PLACEMENT Right 03/23/2018   Procedure: CYSTOSCOPY WITH STENT PLACEMENT;  Surgeon: Billey Co, MD;  Location: [facility] ORS;  Service: Urology;  Laterality: Right;  . CYSTOSCOPY/URETEROSCOPY/HOLMIUM LASER/STENT PLACEMENT Right 04/06/2018   Procedure: CYSTOSCOPY/URETEROSCOPY/HOLMIUM LASER/STENT Exchange;  Surgeon: Billey Co, MD;  Location: [facility] ORS;  Service: Urology;  Laterality: Right;  . KIDNEY STONE SURGERY    . LASIK Bilateral   . SHOULDER ARTHROSCOPY Right     Allergies:  Allergies  Allergen Reactions  . Ciprofloxacin     Causes severe dizziness.    Family History: Family History  Problem Relation Age of Onset  . Kidney cancer Brother   . Kidney disease Neg Hx   . Prostate cancer Neg Hx     Social History:  reports that he has been smoking cigarettes. He has been smoking about  1.50 packs per day. He has never used smokeless tobacco. He reports previous alcohol use. He reports that he does not use drugs.  ROS: Please see flowsheet from today's date for complete review of systems.  Physical Exam: BP (!) 143/88   Pulse 81   Temp 97.9 F (36.6 C)   Resp 16   Ht 5\' 9"  (1.753 m)   Wt 99 kg   SpO2 100%   BMI 32.23 kg/m    Constitutional:  Alert and oriented, No acute distress. Cardiovascular: Regular rate and rhythm Respiratory: Clear to auscultation bilaterally GI: Abdomen is soft, nontender, nondistended, no abdominal masses Lymph: No cervical or inguinal lymphadenopathy. Skin: No rashes, bruises or suspicious lesions. Neurologic: Grossly intact, no focal deficits, moving all 4 extremities. Psychiatric: Normal mood and affect.  Laboratory Data: Urine culture 11/13/2018 no growth  Pertinent Imaging: I have personally reviewed the CT right 8 mm distal ureteral stone with upstream hydronephrosis, smaller lower pole stone  Assessment & Plan:   In summary, he is a comorbid 68 year old male with a 8 mm right distal ureteral stone with upstream hydronephrosis and acute on chronic kidney disease.  We specifically discussed the risks ureteroscopy including bleeding, infection/sepsis, stent related symptoms including flank pain/urgency/frequency/incontinence/dysuria, ureteral injury, inability to access stone, or need for staged or additional procedures.  Right ureteroscopy, laser lithotripsy, stent placement  Billey Co, MD  [facility] [address], [place],  [place] [address] [phone]

## 2018-11-23 NOTE — Anesthesia Procedure Notes (Signed)
Procedure Name: Intubation Date/Time: 11/23/2018 8:25 AM Performed by: Eben Burow, CRNA Pre-anesthesia Checklist: Patient identified, Emergency Drugs available, Suction available and Patient being monitored Patient Re-evaluated:Patient Re-evaluated prior to induction Oxygen Delivery Method: Circle system utilized Preoxygenation: Pre-oxygenation with 100% oxygen Induction Type: IV induction Ventilation: Mask ventilation without difficulty Laryngoscope Size: McGraph and 4 Grade View: Grade I Tube type: Oral Tube size: 8.0 mm Number of attempts: 1 Airway Equipment and Method: Stylet,  Oral airway,  Video-laryngoscopy and LTA kit utilized Placement Confirmation: ETT inserted through vocal cords under direct vision,  positive ETCO2 and breath sounds checked- equal and bilateral Secured at: 23 cm Tube secured with: Tape Dental Injury: Teeth and Oropharynx as per pre-operative assessment

## 2018-11-29 ENCOUNTER — Other Ambulatory Visit: Payer: Medicare Other | Admitting: Urology

## 2018-12-03 ENCOUNTER — Ambulatory Visit (INDEPENDENT_AMBULATORY_CARE_PROVIDER_SITE_OTHER): Payer: Medicare Other | Admitting: Urology

## 2018-12-03 ENCOUNTER — Other Ambulatory Visit: Payer: Self-pay

## 2018-12-03 ENCOUNTER — Encounter: Payer: Self-pay | Admitting: Urology

## 2018-12-03 VITALS — BP 131/74 | HR 94 | Ht 70.0 in | Wt 215.0 lb

## 2018-12-03 DIAGNOSIS — N201 Calculus of ureter: Secondary | ICD-10-CM | POA: Diagnosis not present

## 2018-12-03 LAB — URINALYSIS, COMPLETE
Bilirubin, UA: NEGATIVE
Glucose, UA: NEGATIVE
Ketones, UA: NEGATIVE
Nitrite, UA: NEGATIVE
Specific Gravity, UA: 1.02 (ref 1.005–1.030)
Urobilinogen, Ur: 1 mg/dL (ref 0.2–1.0)
pH, UA: 6 (ref 5.0–7.5)

## 2018-12-03 LAB — MICROSCOPIC EXAMINATION
Bacteria, UA: NONE SEEN
RBC, Urine: >30 /hpf — AB (ref 0–2)

## 2018-12-03 NOTE — Progress Notes (Signed)
Cystoscopy Procedure Note:  Indication: Stent removal s/p right URS/LL/stent for 1cm right distal ureteral stone on 11/23/18  After informed consent and discussion of the procedure and its risks, Christian Cline was positioned and prepped in the standard fashion. Cystoscopy was performed with a flexible cystoscope. The stent was grasped with flexible graspers and removed in its entirety. The patient tolerated the procedure well.  Findings: Uncomplicated stent removal  Assessment and Plan: -4 weeks renal ultrasound to evaluate for silent hydronephrosis, will call with results -Resume flomax in the future if BPH/LUTS -Keep follow up with [facility] for yearly PSA screening/stone prevention  Billey Co, MD 12/03/2018

## 2018-12-03 NOTE — Patient Instructions (Signed)
Dietary Guidelines to Help Prevent Kidney Stones Kidney stones are deposits of minerals and salts that form inside your kidneys. Your risk of developing kidney stones may be greater depending on your diet, your lifestyle, the medicines you take, and whether you have certain medical conditions. Most people can reduce their chances of developing kidney stones by following the instructions below. Depending on your overall health and the type of kidney stones you tend to develop, your dietitian may give you more specific instructions. What are tips for following this plan? Reading food labels  Choose foods with "no salt added" or "low-salt" labels. Limit your sodium intake to less than 1500 mg per day.  Choose foods with calcium for each meal and snack. Try to eat about 300 mg of calcium at each meal. Foods that contain 200-500 mg of calcium per serving include: ? 8 oz (237 ml) of milk, fortified nondairy milk, and fortified fruit juice. ? 8 oz (237 ml) of kefir, yogurt, and soy yogurt. ? 4 oz (118 ml) of tofu. ? 1 oz of cheese. ? 1 cup (300 g) of dried figs. ? 1 cup (91 g) of cooked broccoli. ? 1-3 oz can of sardines or mackerel.  Most people need 1000 to 1500 mg of calcium each day. Talk to your dietitian about how much calcium is recommended for you. Shopping  Buy plenty of fresh fruits and vegetables. Most people do not need to avoid fruits and vegetables, even if they contain nutrients that may contribute to kidney stones.  When shopping for convenience foods, choose: ? Whole pieces of fruit. ? Premade salads with dressing on the side. ? Low-fat fruit and yogurt smoothies.  Avoid buying frozen meals or prepared deli foods.  Look for foods with live cultures, such as yogurt and kefir. Cooking  Do not add salt to food when cooking. Place a salt shaker on the table and allow each person to add his or her own salt to taste.  Use vegetable protein, such as beans, textured vegetable  protein (TVP), or tofu instead of meat in pasta, casseroles, and soups. Meal planning   Eat less salt, if told by your dietitian. To do this: ? Avoid eating processed or premade food. ? Avoid eating fast food.  Eat less animal protein, including cheese, meat, poultry, or fish, if told by your dietitian. To do this: ? Limit the number of times you have meat, poultry, fish, or cheese each week. Eat a diet free of meat at least 2 days a week. ? Eat only one serving each day of meat, poultry, fish, or seafood. ? When you prepare animal protein, cut pieces into small portion sizes. For most meat and fish, one serving is about the size of one deck of cards.  Eat at least 5 servings of fresh fruits and vegetables each day. To do this: ? Keep fruits and vegetables on hand for snacks. ? Eat 1 piece of fruit or a handful of berries with breakfast. ? Have a salad and fruit at lunch. ? Have two kinds of vegetables at dinner.  Limit foods that are high in a substance called oxalate. These include: ? Spinach. ? Rhubarb. ? Beets. ? Potato chips and french fries. ? Nuts.  If you regularly take a diuretic medicine, make sure to eat at least 1-2 fruits or vegetables high in potassium each day. These include: ? Avocado. ? Banana. ? Orange, prune, carrot, or tomato juice. ? Baked potato. ? Cabbage. ? Beans and split   peas. General instructions   Drink enough fluid to keep your urine clear or pale yellow. This is the most important thing you can do.  Talk to your health care provider and dietitian about taking daily supplements. Depending on your health and the cause of your kidney stones, you may be advised: ? Not to take supplements with vitamin C. ? To take a calcium supplement. ? To take a daily probiotic supplement. ? To take other supplements such as magnesium, fish oil, or vitamin B6.  Take all medicines and supplements as told by your health care provider.  Limit alcohol intake to no  more than 1 drink a day for nonpregnant women and 2 drinks a day for men. One drink equals 12 oz of beer, 5 oz of wine, or 1 oz of hard liquor.  Lose weight if told by your health care provider. Work with your dietitian to find strategies and an eating plan that works best for you. What foods are not recommended? Limit your intake of the following foods, or as told by your dietitian. Talk to your dietitian about specific foods you should avoid based on the type of kidney stones and your overall health. Grains Breads. Bagels. Rolls. Baked goods. Salted crackers. Cereal. Pasta. Vegetables Spinach. Rhubarb. Beets. Canned vegetables. Pickles. Olives. Meats and other protein foods Nuts. Nut butters. Large portions of meat, poultry, or fish. Salted or cured meats. Deli meats. Hot dogs. Sausages. Dairy Cheese. Beverages Regular soft drinks. Regular vegetable juice. Seasonings and other foods Seasoning blends with salt. Salad dressings. Canned soups. Soy sauce. Ketchup. Barbecue sauce. Canned pasta sauce. Casseroles. Pizza. Lasagna. Frozen meals. Potato chips. French fries. Summary  You can reduce your risk of kidney stones by making changes to your diet.  The most important thing you can do is drink enough fluid. You should drink enough fluid to keep your urine clear or pale yellow.  Ask your health care provider or dietitian how much protein from animal sources you should eat each day, and also how much salt and calcium you should have each day. This information is not intended to replace advice given to you by your health care provider. Make sure you discuss any questions you have with your health care provider. Document Released: 06/25/2010 Document Revised: 06/20/2018 Document Reviewed: 02/09/2016 Elsevier Patient Education  2020 [employer]  

## 2019-01-02 ENCOUNTER — Ambulatory Visit
Admission: RE | Admit: 2019-01-02 | Discharge: 2019-01-02 | Disposition: A | Discharge status: Home / Self Care | Payer: Medicare Other | Source: Ambulatory Visit | Attending: Urology | Admitting: Urology

## 2019-01-02 ENCOUNTER — Other Ambulatory Visit: Payer: Self-pay

## 2019-01-02 DIAGNOSIS — N201 Calculus of ureter: Secondary | ICD-10-CM | POA: Insufficient documentation

## 2019-01-03 ENCOUNTER — Telehealth: Payer: Self-pay

## 2019-01-03 NOTE — Telephone Encounter (Signed)
-----   Message from Billey Co, MD sent at 01/03/2019  9:21 AM EDT ----- Doristine Devoid news, renal US looks good, kidneys are draining well  Nickolas Madrid, MD 01/03/2019

## 2019-01-03 NOTE — Telephone Encounter (Signed)
Patient called back gave results He had a virtual app on 01-14-19 for the results and we cx this     Sharyn Lull

## 2019-01-03 NOTE — Telephone Encounter (Signed)
Left pt mess to call no DPR on file 

## 2019-01-14 ENCOUNTER — Telehealth (INDEPENDENT_AMBULATORY_CARE_PROVIDER_SITE_OTHER): Payer: Medicare Other | Admitting: Urology

## 2019-01-14 ENCOUNTER — Other Ambulatory Visit: Payer: Self-pay

## 2019-01-14 DIAGNOSIS — N200 Calculus of kidney: Secondary | ICD-10-CM

## 2019-01-14 NOTE — Progress Notes (Signed)
Virtual Visit via Telephone Note  I connected with Andria Meuse on 01/14/19 at  9:00 AM EST by telephone and verified that I am speaking with the correct person using two identifiers.   I discussed the limitations, risks, security and privacy concerns of performing an evaluation and management service by telephone and the availability of in person appointments. We discussed the impact of the COVID-19 pandemic on the healthcare system, and the importance of social distancing and reducing patient and provider exposure. I also discussed with the patient that there may be a patient responsible charge related to this service. The patient expressed understanding and agreed to proceed.  Reason for visit: Discuss renal ultrasound results after ureteroscopy  History of Present Illness: I had phone follow-up with Mr. Ostos today regarding his renal ultrasound after ureteroscopy.  He is a 68 year old comorbid male with history of CKD and recurrent nephrolithiasis, most recently status post right ureteroscopy, laser lithotripsy, stent placement for a 1 cm right distal ureteral stone on 11/23/2018.  His stent has since been removed and he denies any complaints today.    He had a follow-up renal ultrasound on 01/02/2019 that showed no residual hydronephrosis.  He does have some nonobstructive stones on the left side.  He is regularly followed by Dr. Bernardo Heater for PSA screening and nephrolithiasis.  We discussed general stone prevention strategies including adequate hydration with goal of producing 2.5 L of urine daily, increasing citric acid intake, increasing calcium intake during high oxalate meals, minimizing animal protein, and decreasing salt intake. Information about dietary recommendations given today.   Follow Up: Keep follow-up with Dr. Bernardo Heater in August 2021 for routine follow-up    I discussed the assessment and treatment plan with the patient. The patient was provided an opportunity to ask  questions and all were answered. The patient agreed with the plan and demonstrated an understanding of the instructions.   The patient was advised to call back or seek an in-person evaluation if the symptoms worsen or if the condition fails to improve as anticipated.  I provided 11 minutes of non-face-to-face time during this encounter.   Billey Co, MD

## 2019-08-05 ENCOUNTER — Other Ambulatory Visit: Payer: Self-pay

## 2019-08-05 ENCOUNTER — Encounter: Payer: Self-pay | Admitting: Ophthalmology

## 2019-08-09 ENCOUNTER — Other Ambulatory Visit
Admission: RE | Admit: 2019-08-09 | Discharge: 2019-08-09 | Disposition: A | Discharge status: Home / Self Care | Payer: Medicare Other | Source: Ambulatory Visit | Attending: Ophthalmology | Admitting: Ophthalmology

## 2019-08-09 ENCOUNTER — Other Ambulatory Visit: Payer: Self-pay

## 2019-08-09 DIAGNOSIS — Z01812 Encounter for preprocedural laboratory examination: Secondary | ICD-10-CM | POA: Insufficient documentation

## 2019-08-09 DIAGNOSIS — Z20822 Contact with and (suspected) exposure to covid-19: Secondary | ICD-10-CM | POA: Diagnosis not present

## 2019-08-09 LAB — SARS CORONAVIRUS 2 (TAT 6-24 HRS): SARS Coronavirus 2: NEGATIVE

## 2019-08-13 ENCOUNTER — Ambulatory Visit: Payer: Medicare Other | Admitting: Anesthesiology

## 2019-08-13 ENCOUNTER — Other Ambulatory Visit: Payer: Self-pay

## 2019-08-13 ENCOUNTER — Encounter: Payer: Self-pay | Admitting: Ophthalmology

## 2019-08-13 ENCOUNTER — Ambulatory Visit
Admission: RE | Admit: 2019-08-13 | Discharge: 2019-08-13 | Disposition: A | Discharge status: Home / Self Care | Payer: Medicare Other | Attending: Ophthalmology | Admitting: Ophthalmology

## 2019-08-13 ENCOUNTER — Encounter
Admission: RE | Disposition: A | Discharge status: Home / Self Care | Payer: Self-pay | Source: Home / Self Care | Attending: Ophthalmology

## 2019-08-13 DIAGNOSIS — I10 Essential (primary) hypertension: Secondary | ICD-10-CM | POA: Insufficient documentation

## 2019-08-13 DIAGNOSIS — E7800 Pure hypercholesterolemia, unspecified: Secondary | ICD-10-CM | POA: Insufficient documentation

## 2019-08-13 DIAGNOSIS — J449 Chronic obstructive pulmonary disease, unspecified: Secondary | ICD-10-CM | POA: Insufficient documentation

## 2019-08-13 DIAGNOSIS — H2512 Age-related nuclear cataract, left eye: Secondary | ICD-10-CM | POA: Insufficient documentation

## 2019-08-13 DIAGNOSIS — F17200 Nicotine dependence, unspecified, uncomplicated: Secondary | ICD-10-CM | POA: Insufficient documentation

## 2019-08-13 DIAGNOSIS — G4730 Sleep apnea, unspecified: Secondary | ICD-10-CM | POA: Diagnosis not present

## 2019-08-13 DIAGNOSIS — I480 Paroxysmal atrial fibrillation: Secondary | ICD-10-CM | POA: Diagnosis not present

## 2019-08-13 DIAGNOSIS — M109 Gout, unspecified: Secondary | ICD-10-CM | POA: Insufficient documentation

## 2019-08-13 DIAGNOSIS — Z7901 Long term (current) use of anticoagulants: Secondary | ICD-10-CM | POA: Diagnosis not present

## 2019-08-13 DIAGNOSIS — K219 Gastro-esophageal reflux disease without esophagitis: Secondary | ICD-10-CM | POA: Insufficient documentation

## 2019-08-13 HISTORY — DX: Paroxysmal atrial fibrillation: I48.0

## 2019-08-13 HISTORY — PX: CATARACT EXTRACTION W/PHACO: SHX586

## 2019-08-13 HISTORY — DX: Presence of dental prosthetic device (complete) (partial): Z97.2

## 2019-08-13 HISTORY — DX: Dizziness and giddiness: R42

## 2019-08-13 SURGERY — PHACOEMULSIFICATION, CATARACT, WITH IOL INSERTION
Anesthesia: Monitor Anesthesia Care | Site: Eye | Laterality: Left

## 2019-08-13 MED ORDER — TETRACAINE HCL 0.5 % OP SOLN
1.0000 [drp] | OPHTHALMIC | Status: DC | PRN
  Administered 2019-08-13 (×3): 1 [drp] via OPHTHALMIC

## 2019-08-13 MED ORDER — NA CHONDROIT SULF-NA HYALURON 40-17 MG/ML IO SOLN
INTRAOCULAR | Status: DC | PRN
  Administered 2019-08-13: 1 mL via INTRAOCULAR

## 2019-08-13 MED ORDER — ACETAMINOPHEN 160 MG/5ML PO SOLN: 325.0000 mg | ORAL | Status: DC | PRN

## 2019-08-13 MED ORDER — EPINEPHRINE PF 1 MG/ML IJ SOLN
INTRAOCULAR | Status: DC | PRN
  Administered 2019-08-13: 46 mL via OPHTHALMIC

## 2019-08-13 MED ORDER — MIDAZOLAM HCL 2 MG/2ML IJ SOLN
INTRAMUSCULAR | Status: DC | PRN
  Administered 2019-08-13: 2 mg via INTRAVENOUS

## 2019-08-13 MED ORDER — LIDOCAINE HCL (PF) 2 % IJ SOLN
INTRAOCULAR | Status: DC | PRN
  Administered 2019-08-13: 1 mL

## 2019-08-13 MED ORDER — FENTANYL CITRATE (PF) 100 MCG/2ML IJ SOLN
INTRAMUSCULAR | Status: DC | PRN
  Administered 2019-08-13 (×2): 50 ug via INTRAVENOUS

## 2019-08-13 MED ORDER — ONDANSETRON HCL 4 MG/2ML IJ SOLN: 4.0000 mg | Freq: Once | INTRAMUSCULAR | Status: DC | PRN

## 2019-08-13 MED ORDER — BRIMONIDINE TARTRATE-TIMOLOL 0.2-0.5 % OP SOLN
OPHTHALMIC | Status: DC | PRN
  Administered 2019-08-13: 1 [drp] via OPHTHALMIC

## 2019-08-13 MED ORDER — ARMC OPHTHALMIC DILATING DROPS
1.0000 "application " | OPHTHALMIC | Status: DC | PRN
  Administered 2019-08-13 (×3): 1 via OPHTHALMIC

## 2019-08-13 MED ORDER — ACETAMINOPHEN 325 MG PO TABS: 325.0000 mg | ORAL_TABLET | ORAL | Status: DC | PRN

## 2019-08-13 MED ORDER — MOXIFLOXACIN HCL 0.5 % OP SOLN
OPHTHALMIC | Status: DC | PRN
  Administered 2019-08-13: 0.2 mL via OPHTHALMIC

## 2019-08-13 SURGICAL SUPPLY — 22 items
CANNULA ANT/CHMB 27G (MISCELLANEOUS) ×2 IMPLANT
CANNULA ANT/CHMB 27GA (MISCELLANEOUS) ×6 IMPLANT
GLOVE SURG LX 8.0 MICRO (GLOVE) ×4
GLOVE SURG LX STRL 8.0 MICRO (GLOVE) ×1 IMPLANT
GLOVE SURG TRIUMPH 8.0 PF LTX (GLOVE) ×3 IMPLANT
GOWN STRL REUS W/ TWL LRG LVL3 (GOWN DISPOSABLE) ×2 IMPLANT
GOWN STRL REUS W/TWL LRG LVL3 (GOWN DISPOSABLE) ×4
LENS IOL TECNIS 20.5 (Intraocular Lens) ×3 IMPLANT
LENS IOL TECNIS MONO 1P 20.5 (Intraocular Lens) IMPLANT
MARKER SKIN DUAL TIP RULER LAB (MISCELLANEOUS) ×3 IMPLANT
NDL FILTER BLUNT 18X1 1/2 (NEEDLE) ×1 IMPLANT
NEEDLE FILTER BLUNT 18X 1/2SAF (NEEDLE) ×2
NEEDLE FILTER BLUNT 18X1 1/2 (NEEDLE) ×1 IMPLANT
PACK EYE AFTER SURG (MISCELLANEOUS) ×3 IMPLANT
PACK OPTHALMIC (MISCELLANEOUS) ×3 IMPLANT
PACK PORFILIO (MISCELLANEOUS) ×3 IMPLANT
SUT ETHILON 10-0 CS-B-6CS-B-6 (SUTURE)
SUTURE EHLN 10-0 CS-B-6CS-B-6 (SUTURE) IMPLANT
SYR 3ML LL SCALE MARK (SYRINGE) ×3 IMPLANT
SYR TB 1ML LUER SLIP (SYRINGE) ×3 IMPLANT
WATER STERILE IRR 250ML POUR (IV SOLUTION) ×3 IMPLANT
WIPE NON LINTING 3.25X3.25 (MISCELLANEOUS) ×3 IMPLANT

## 2019-08-13 NOTE — Anesthesia Procedure Notes (Signed)
Procedure Name: MAC Date/Time: 08/13/2019 11:58 AM Performed by: Jeannene Patella, CRNA Pre-anesthesia Checklist: Patient identified, Emergency Drugs available, Suction available, Patient being monitored and Timeout performed Patient Re-evaluated:Patient Re-evaluated prior to induction Oxygen Delivery Method: Nasal cannula

## 2019-08-13 NOTE — Anesthesia Preprocedure Evaluation (Signed)
Anesthesia Evaluation  Patient identified by MRN, date of birth, ID band Patient awake    Reviewed: Allergy & Precautions, NPO status , Patient's Chart, lab work & pertinent test results  Airway Mallampati: II  TM Distance: >3 FB     Dental  (+) Partial Upper, Lower Dentures   Pulmonary sleep apnea , COPD, Current Smoker and Patient abstained from smoking.,    breath sounds clear to auscultation       Cardiovascular hypertension, + dysrhythmias (PAF)  Rhythm:Regular Rate:Normal     Neuro/Psych    GI/Hepatic GERD  ,  Endo/Other    Renal/GU Kidney stones     Musculoskeletal  (+) Arthritis , Gout   Abdominal   Peds  Hematology   Anesthesia Other Findings   Reproductive/Obstetrics                             Anesthesia Physical Anesthesia Plan  ASA: III  Anesthesia Plan: MAC   Post-op Pain Management:    Induction: Intravenous  PONV Risk Score and Plan: TIVA, Midazolam and Treatment may vary due to age or medical condition  Airway Management Planned: Natural Airway and Nasal Cannula  Additional Equipment:   Intra-op Plan:   Post-operative Plan:   Informed Consent: I have reviewed the patients History and Physical, chart, labs and discussed the procedure including the risks, benefits and alternatives for the proposed anesthesia with the patient or authorized representative who has indicated his/her understanding and acceptance.       Plan Discussed with: CRNA  Anesthesia Plan Comments:         Anesthesia Quick Evaluation

## 2019-08-13 NOTE — Transfer of Care (Signed)
Immediate Anesthesia Transfer of Care Note  Patient: Christian Cline  Procedure(s) Performed: CATARACT EXTRACTION PHACO AND INTRAOCULAR LENS PLACEMENT (IOC) LEFT (Left Eye)  Patient Location: PACU  Anesthesia Type: MAC  Level of Consciousness: awake, alert  and patient cooperative  Airway and Oxygen Therapy: Patient Spontanous Breathing and Patient connected to supplemental oxygen  Post-op Assessment: Post-op Vital signs reviewed, Patient's Cardiovascular Status Stable, Respiratory Function Stable, Patent Airway and No signs of Nausea or vomiting  Post-op Vital Signs: Reviewed and stable  Complications: No apparent anesthesia complications

## 2019-08-13 NOTE — Op Note (Signed)
PREOPERATIVE DIAGNOSIS:  Nuclear sclerotic cataract of the left eye.   POSTOPERATIVE DIAGNOSIS:  Nuclear sclerotic cataract of the left eye.   OPERATIVE PROCEDURE:@   SURGEON:  Birder Robson, MD.   ANESTHESIA:  Anesthesiologist: Veda Canning, MD CRNA: Jeannene Patella, CRNA; Cameron Ali, CRNA  1.      Managed anesthesia care. 2.     0.19ml of Shugarcaine was instilled following the paracentesis   COMPLICATIONS:  None.   TECHNIQUE:   Stop and chop   DESCRIPTION OF PROCEDURE:  The patient was examined and consented in the preoperative holding area where the aforementioned topical anesthesia was applied to the left eye and then brought back to the Operating Room where the left eye was prepped and draped in the usual sterile ophthalmic fashion and a lid speculum was placed. A paracentesis was created with the side port blade and the anterior chamber was filled with viscoelastic. A near clear corneal incision was performed with the steel keratome. A continuous curvilinear capsulorrhexis was performed with a cystotome followed by the capsulorrhexis forceps. Hydrodissection and hydrodelineation were carried out with BSS on a blunt cannula. The lens was removed in a stop and chop  technique and the remaining cortical material was removed with the irrigation-aspiration handpiece. The capsular bag was inflated with viscoelastic and the Technis [id number] lens was placed in the capsular bag without complication. The remaining viscoelastic was removed from the eye with the irrigation-aspiration handpiece. The wounds were hydrated. The anterior chamber was flushed with BSS and the eye was inflated to physiologic pressure. 0.78ml Vigamox was placed in the anterior chamber. The wounds were found to be water tight. The eye was dressed with Combigan. The patient was given protective glasses to wear throughout the day and a shield with which to sleep tonight. The patient was also given drops with which to begin a  drop regimen today and will follow-up with me in one day. Implant Name Type Inv. Item Serial No. Manufacturer Lot No. LRB No. Used Action  LENS IOL TECNIS 20.5 - [id number] Intraocular Lens LENS IOL TECNIS 20.5 [id number] AMO  Left 1 Implanted    Procedure(s) with comments: CATARACT EXTRACTION PHACO AND INTRAOCULAR LENS PLACEMENT (IOC) LEFT (Left) - sleep apnea  Electronically signed: Birder Robson 08/13/2019 12:12 PM

## 2019-08-13 NOTE — H&P (Signed)
All labs reviewed. Abnormal studies sent to patients PCP when indicated.  Previous H&P reviewed, patient examined, there are NO CHANGES.  Christian Degrasse Porfilio6/1/202111:50 AM

## 2019-08-13 NOTE — Discharge Instructions (Signed)

## 2019-08-13 NOTE — Anesthesia Postprocedure Evaluation (Signed)
Anesthesia Post Note  Patient: Christian Cline  Procedure(s) Performed: CATARACT EXTRACTION PHACO AND INTRAOCULAR LENS PLACEMENT (IOC) LEFT (Left Eye)     Patient location during evaluation: PACU Anesthesia Type: MAC Level of consciousness: awake Pain management: pain level controlled Vital Signs Assessment: post-procedure vital signs reviewed and stable Respiratory status: respiratory function stable Cardiovascular status: stable Postop Assessment: no apparent nausea or vomiting Anesthetic complications: no    Veda Canning

## 2019-08-14 ENCOUNTER — Encounter: Payer: Self-pay | Admitting: *Deleted

## 2019-10-14 ENCOUNTER — Ambulatory Visit: Payer: Medicare Other | Admitting: Urology

## 2019-10-16 ENCOUNTER — Ambulatory Visit
Admission: RE | Admit: 2019-10-16 | Discharge: 2019-10-16 | Disposition: A | Discharge status: Home / Self Care | Payer: Medicare Other | Attending: Urology | Admitting: Urology

## 2019-10-16 ENCOUNTER — Ambulatory Visit (INDEPENDENT_AMBULATORY_CARE_PROVIDER_SITE_OTHER): Payer: Medicare Other | Admitting: Urology

## 2019-10-16 ENCOUNTER — Ambulatory Visit
Admission: RE | Admit: 2019-10-16 | Discharge: 2019-10-16 | Disposition: A | Discharge status: Home / Self Care | Payer: Medicare Other | Source: Ambulatory Visit | Attending: Urology | Admitting: Urology

## 2019-10-16 ENCOUNTER — Encounter: Payer: Self-pay | Admitting: Urology

## 2019-10-16 ENCOUNTER — Other Ambulatory Visit: Payer: Self-pay

## 2019-10-16 VITALS — BP 145/79 | HR 82 | Ht 70.0 in | Wt 216.0 lb

## 2019-10-16 DIAGNOSIS — N401 Enlarged prostate with lower urinary tract symptoms: Secondary | ICD-10-CM | POA: Diagnosis not present

## 2019-10-16 DIAGNOSIS — Z87898 Personal history of other specified conditions: Secondary | ICD-10-CM

## 2019-10-16 DIAGNOSIS — N200 Calculus of kidney: Secondary | ICD-10-CM

## 2019-10-16 DIAGNOSIS — N5201 Erectile dysfunction due to arterial insufficiency: Secondary | ICD-10-CM | POA: Diagnosis not present

## 2019-10-17 ENCOUNTER — Encounter: Payer: Self-pay | Admitting: Urology

## 2019-10-17 LAB — PSA: Prostate Specific Ag, Serum: 4.1 ng/mL — ABNORMAL HIGH (ref 0.0–4.0)

## 2019-10-17 NOTE — Progress Notes (Signed)
10/16/2019 8:44 AM   Christian Cline 06-09-1950 [id number]  Referring provider: Renee Rival, NP [place],  [place] [address]  Chief Complaint  Patient presents with  . Elevated PSA    Urologic history:  1.  BPH with lower urinary tract symptoms             -Previously on tamsulosin, discontinued 2021  2.  History elevated PSA             -12.2 03/2017; repeat PSAs have remained at baseline  3.  Nephrolithiasis             -Ureteroscopic removal 13 mm right UPJ stone 03/2018             -Nonobstructing left renal calculi  -Ureteroscopic removal 8 mm distal calculus 11/2018  4.  Erectile dysfunction  -On vardenafil   HPI: 69 y.o. male presents for follow-up.   Last visit with me 10/12/2018  Right ureteroscopic stone removal for an impacted 1 cm distal ureteral calculus by Dr. Diamantina Providence 11/23/2018  Follow-up renal ultrasound showed resolution of hydronephrosis  Currently has no complaints.  He is no longer taking tamsulosin.  His voiding symptoms are not bothersome enough that he desires to continue this medication  Denies dysuria, gross hematuria  Denies flank, abdominal or pelvic pain  Last PSA 10/10/2018 was 3.4  PMH: Past Medical History:  Diagnosis Date  . Acid reflux   . Arthritis   . Bell's palsy   . COPD (chronic obstructive pulmonary disease) ([facility])   . Gout   . Heart murmur   . History of kidney stones   . Hyperlipidemia   . Hypertension   . PAF (paroxysmal atrial fibrillation) ([facility])   . Sleep apnea   . Vertigo    3-4 episodes.  last one over 1 yr ago  . Wears dentures    Partial upper, full lower    Surgical History: Past Surgical History:  Procedure Laterality Date  . CATARACT EXTRACTION W/PHACO Left 08/13/2019   Procedure: CATARACT EXTRACTION PHACO AND INTRAOCULAR LENS PLACEMENT ([facility]) LEFT;  Surgeon: Birder Robson, MD;  Location: [facility];  Service: Ophthalmology;  Laterality: Left;  sleep apnea  .  COLONOSCOPY WITH PROPOFOL    . CYSTOSCOPY W/ RETROGRADES Right 03/23/2018   Procedure: CYSTOSCOPY WITH RETROGRADE PYELOGRAM;  Surgeon: Billey Co, MD;  Location: [facility];  Service: Urology;  Laterality: Right;  . CYSTOSCOPY W/ RETROGRADES Right 11/23/2018   Procedure: CYSTOSCOPY WITH RETROGRADE PYELOGRAM;  Surgeon: Billey Co, MD;  Location: [facility];  Service: Urology;  Laterality: Right;  . CYSTOSCOPY WITH STENT PLACEMENT Right 03/23/2018   Procedure: CYSTOSCOPY WITH STENT PLACEMENT;  Surgeon: Billey Co, MD;  Location: [facility];  Service: Urology;  Laterality: Right;  . CYSTOSCOPY WITH URETHRAL DILATATION N/A 11/23/2018   Procedure: CYSTOSCOPY WITH URETHRAL DILATATION;  Surgeon: Billey Co, MD;  Location: [facility];  Service: Urology;  Laterality: N/A;  . CYSTOSCOPY/URETEROSCOPY/HOLMIUM LASER/STENT PLACEMENT Right 04/06/2018   Procedure: CYSTOSCOPY/URETEROSCOPY/HOLMIUM LASER/STENT Exchange;  Surgeon: Billey Co, MD;  Location: [facility];  Service: Urology;  Laterality: Right;  . CYSTOSCOPY/URETEROSCOPY/HOLMIUM LASER/STENT PLACEMENT Right 11/23/2018   Procedure: CYSTOSCOPY/URETEROSCOPY/HOLMIUM LASER/STENT PLACEMENT;  Surgeon: Billey Co, MD;  Location: [facility];  Service: Urology;  Laterality: Right;  . KIDNEY STONE SURGERY    . LASIK Bilateral   . SHOULDER ARTHROSCOPY Right     Home Medications:  Allergies as of 10/16/2019  Reactions   Ciprofloxacin    Causes severe dizziness.      Medication List       Accurate as of October 16, 2019 11:59 PM. If you have any questions, ask your nurse or doctor.        albuterol 108 (90 Base) MCG/ACT inhaler Commonly known as: VENTOLIN HFA Inhale 2 puffs into the lungs every 6 (six) hours as needed for wheezing or shortness of breath.   allopurinol 300 MG tablet Commonly known as: ZYLOPRIM Take 300 mg by mouth daily.   aspirin EC 81 MG tablet Take 81 mg by mouth daily.   atorvastatin 40 MG tablet Commonly  known as: LIPITOR Take 40 mg by mouth daily.   chlorthalidone 25 MG tablet Commonly known as: HYGROTON Take 25 mg by mouth every morning.   docusate sodium 100 MG capsule Commonly known as: COLACE Take 100 mg by mouth daily.   Eliquis 5 MG Tabs tablet Generic drug: apixaban Take 5 mg by mouth 2 (two) times daily.   famotidine 20 MG tablet Commonly known as: PEPCID Take 20 mg by mouth 2 (two) times daily.   Fluticasone-Salmeterol 250-50 MCG/DOSE Aepb Commonly known as: Advair Diskus Inhale 1 puff into the lungs 2 (two) times daily.   quinapril 40 MG tablet Commonly known as: ACCUPRIL Take 40 mg by mouth daily.   vardenafil 20 MG tablet Commonly known as: LEVITRA 1 tab 60 minutes prior to intercourse   verapamil 360 MG 24 hr capsule Commonly known as: VERELAN PM Take 360 mg by mouth at bedtime.       Allergies:  Allergies  Allergen Reactions  . Ciprofloxacin     Causes severe dizziness.    Family History: Family History  Problem Relation Age of Onset  . Kidney cancer Brother   . Kidney disease Neg Hx   . Prostate cancer Neg Hx     Social History:  reports that he has been smoking cigarettes. He has a 50.00 pack-year smoking history. He has never used smokeless tobacco. He reports previous alcohol use. He reports that he does not use drugs.   Physical Exam: BP (!) 145/79   Pulse 82   Ht 5\' 10"  (1.778 m)   Wt 216 lb (98 kg)   BMI 30.99 kg/m   Constitutional:  Alert and oriented, No acute distress. HEENT: [place] AT, moist mucus membranes.  Trachea midline, no masses. Cardiovascular: No clubbing, cyanosis, or edema. Respiratory: Normal respiratory effort, no increased work of breathing. GU: Prostate 50 g, smooth without nodules Skin: No rashes, bruises or suspicious lesions. Neurologic: Grossly intact, no focal deficits, moving all 4 extremities. Psychiatric: Normal mood and affect.   Assessment & Plan:    1. Nephrolithiasis  CT August/2020 with  bilateral, nonobstructing renal calculi  KUB today and will call with results  Follow-up 1 year with KUB  2. Benign prostatic hyperplasia with LUTS  Mild lower urinary tract symptoms  He has discontinued tamsulosin  3.  History elevated PSA  Benign DRE  PSA drawn today  Continue annual follow-up   [name], MD  [facility] [address] [place], [place] [address] [phone]

## 2019-10-18 ENCOUNTER — Telehealth: Payer: Self-pay | Admitting: Family Medicine

## 2019-10-18 NOTE — Telephone Encounter (Signed)
-----   Message from [name], MD sent at 10/18/2019  3:03 PM EDT ----- PSA 4.1 which is minimally elevated above baseline.  Repeat 1 year

## 2019-10-18 NOTE — Telephone Encounter (Signed)
-----   Message from [name], MD sent at 10/17/2019  9:45 PM EDT ----- KUB shows 2 calculi in the right kidney and 5 in the left kidney which appear to be in nonobstructing positions.  These were present on his prior CT.

## 2019-10-18 NOTE — Telephone Encounter (Signed)
Unable to leave message

## 2019-10-18 NOTE — Telephone Encounter (Signed)
Called pt informed him of the information below. Pt gave verbal understanding.  

## 2019-10-18 NOTE — Telephone Encounter (Signed)
Patient notified and voiced understanding.

## 2019-12-29 IMAGING — US US RENAL
1 series · 13 of 25 positions shown · non-contrast
Comparison: Renal ultrasound dated 10/25/2018 and CT of the abdomen
pelvis dated 11/05/2018

CLINICAL DATA: 68-year-old male with right renal calculus and
hydronephrosis.

EXAM:
RENAL / URINARY TRACT ULTRASOUND COMPLETE

[Series 1: us renal · 0.30mm/px · 13 of 49 slices shown]
[im 1/49]
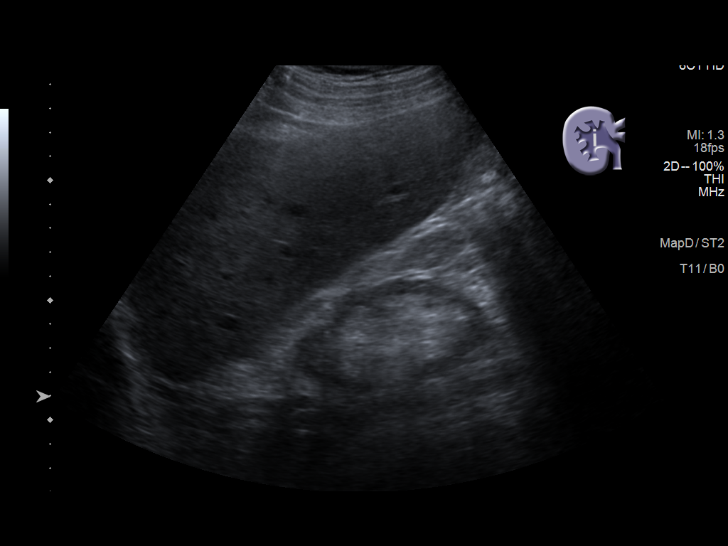
[im 5/49]
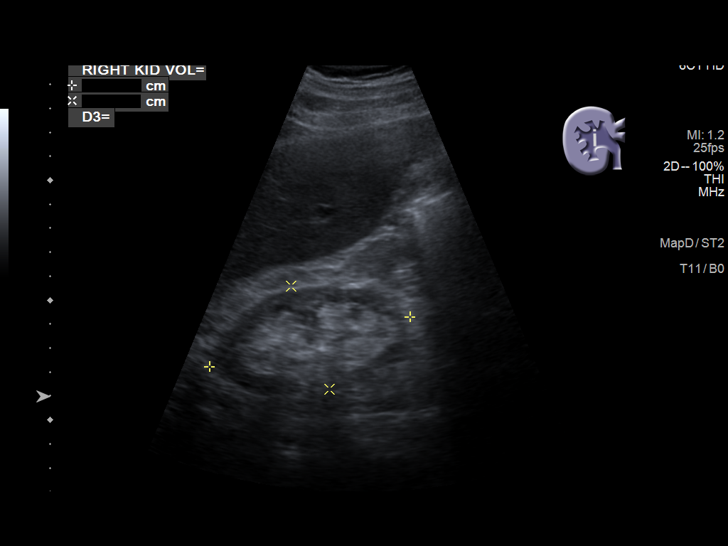
[im 9/49]
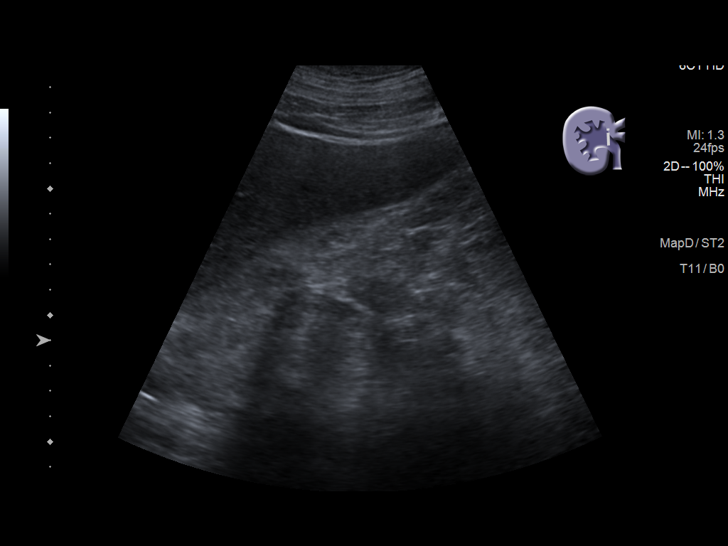
[im 13/49]
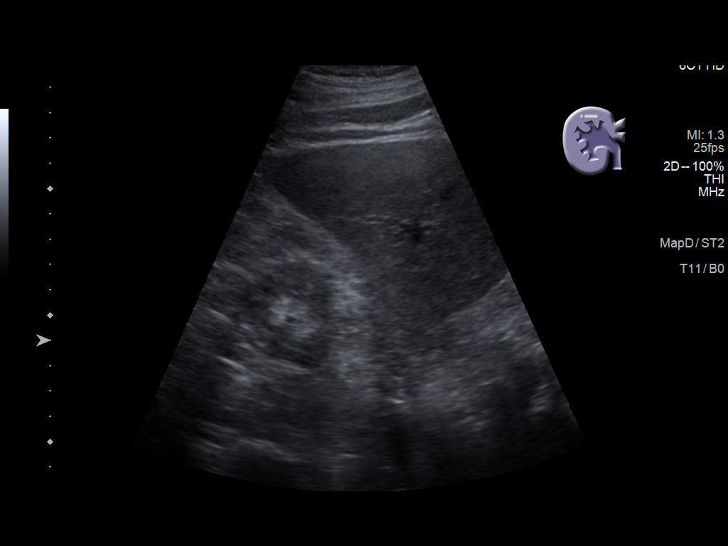
[im 17/49]
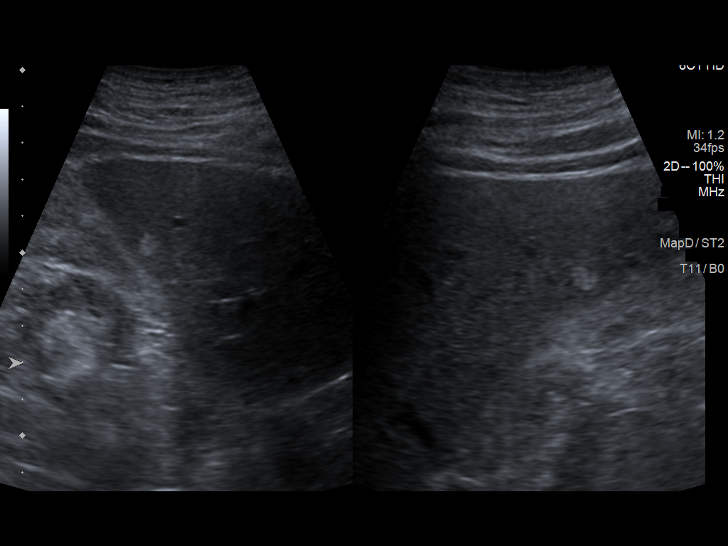
[im 21/49]
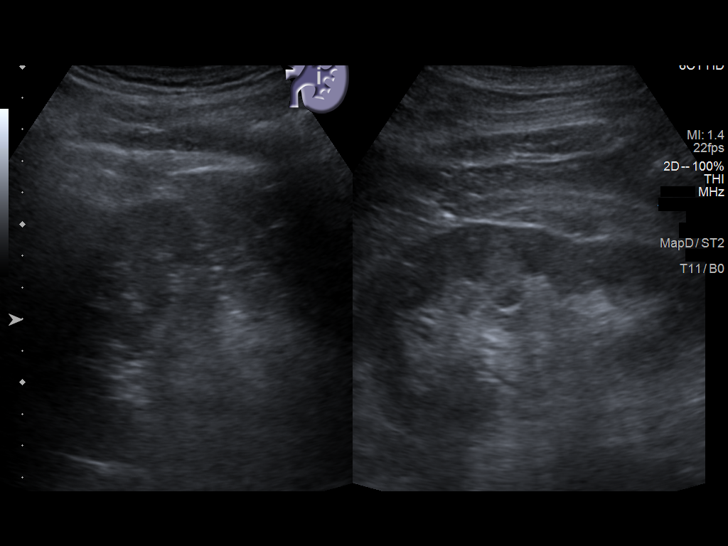
[im 25/49]
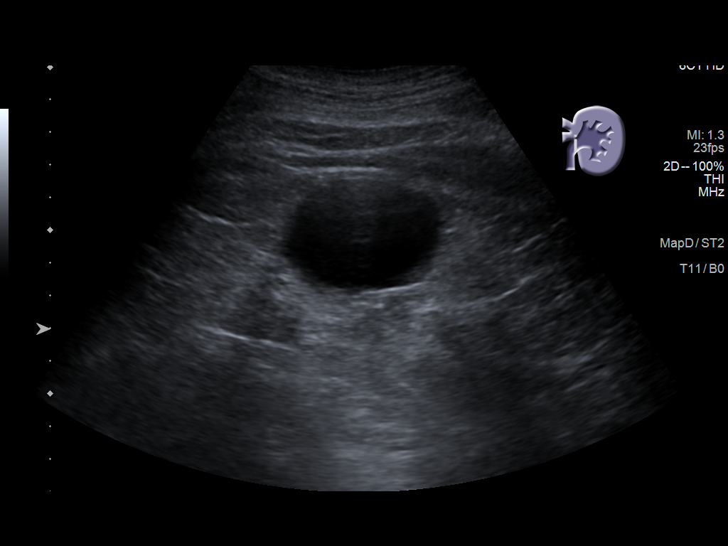
[im 29/49]
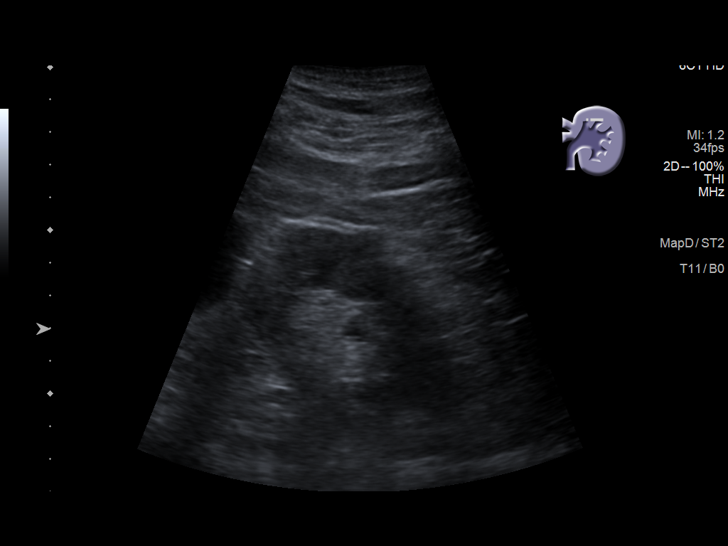
[im 33/49]
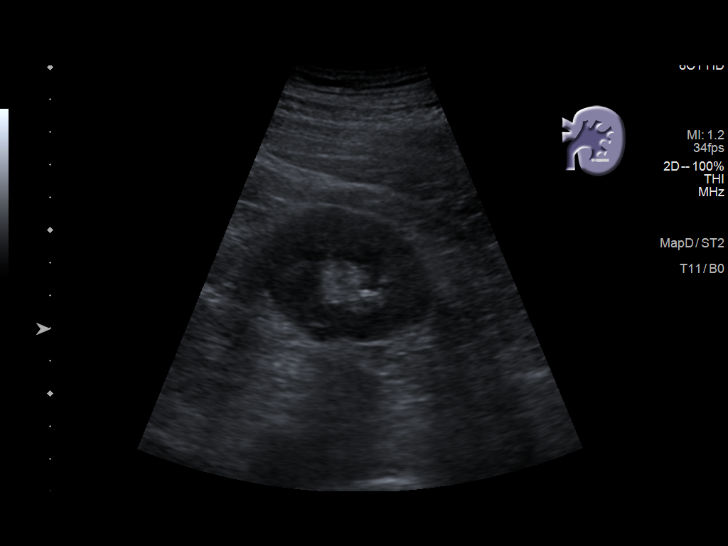
[im 37/49]
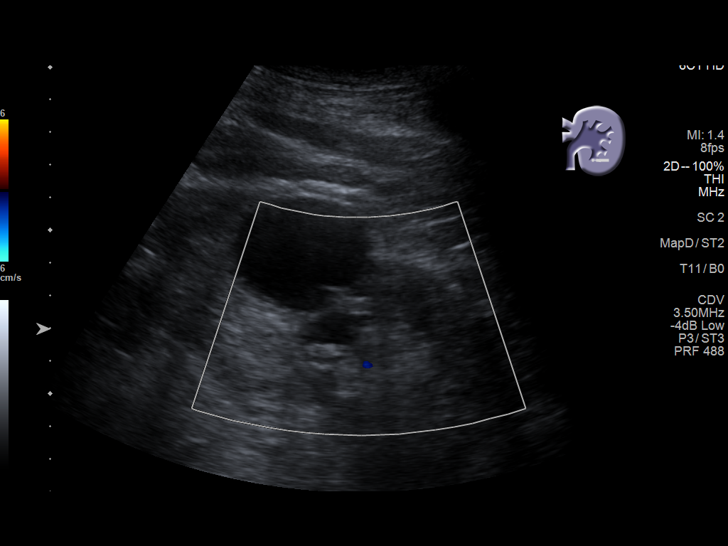
[im 41/49]
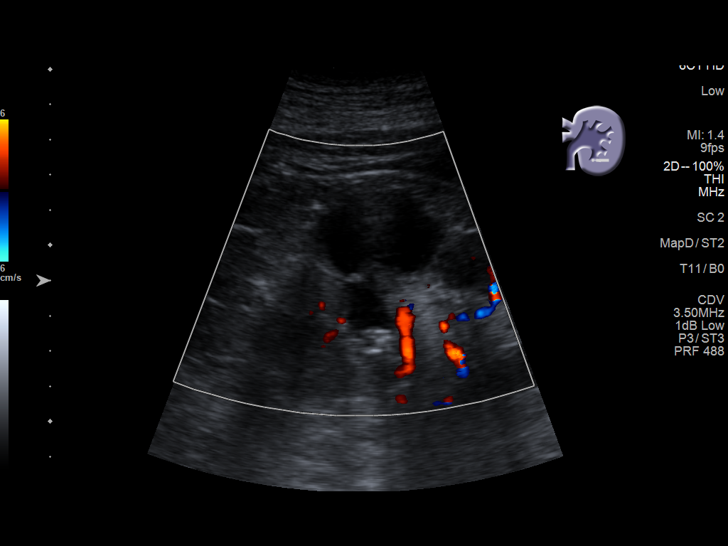
[im 45/49]
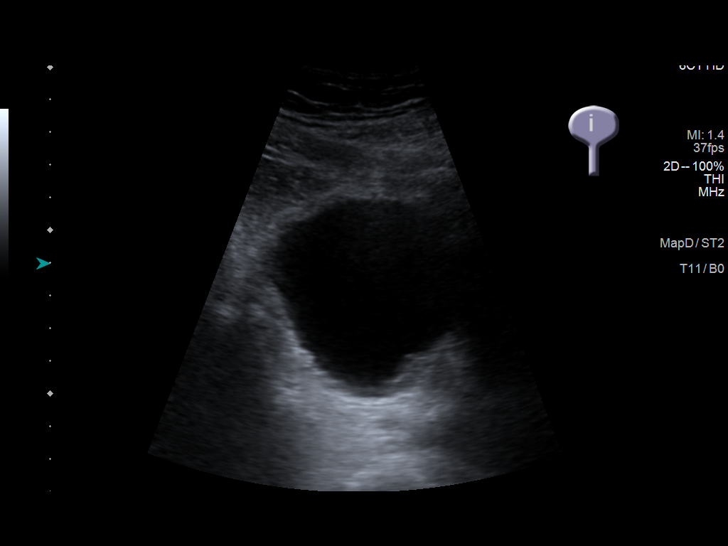
[im 49/49]
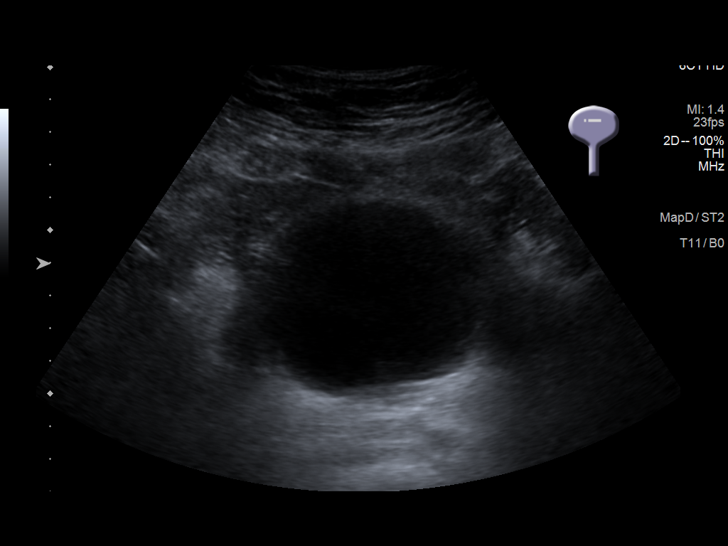

[13 of 25 positions shown; findings below may reference images not displayed]

FINDINGS: Right Kidney:

Renal measurements: 10.2 x 4.3 x 3.9 cm = volume: 90 mL. There is
moderate parenchyma atrophy and cortical thinning. There is diffuse
increased parenchymal echogenicity. No hydronephrosis or shadowing
stone.

Left Kidney:

Renal measurements: 11.4 x 5.9 x 4.0 cm = volume: 140 mL. Mild
increased echogenicity. No hydronephrosis. Several shadowing calculi
measure up to 10 mm in the interpolar aspect of the left kidney. No
hydronephrosis. There multiple left renal cysts with the largest
measuring 4.7 x 4.1 x 4.8 cm.

Bladder:

Appears normal for degree of bladder distention.

Other:

There is a 7 x 8 x 9 mm echogenic lesion in the right lobe of the
liver. No internal vascularity identified within this lesion on
color image. This lesion is suboptimally characterized but the
sonographic features are most consistent with hemangioma. Attention
on follow-up imaging recommended.
IMPRESSION: 1. Moderate right renal parenchyma atrophy. No hydronephrosis or
shadowing stone.
2. Nonobstructing left renal calculi. No hydronephrosis. Several
left renal cysts.
3. Mildly increased renal echogenicity likely related to underlying
medical renal disease. Clinical correlation is recommended.
4. Subcentimeter echogenic liver lesion with sonographic features
most consistent with a small hemangioma. Attention on follow-up
imaging recommended.

## 2020-10-15 ENCOUNTER — Ambulatory Visit: Payer: Self-pay | Admitting: Urology

## 2020-10-21 ENCOUNTER — Inpatient Hospital Stay: Admit: 2020-10-21 | Payer: Medicare Other

## 2020-10-21 ENCOUNTER — Ambulatory Visit
Admission: RE | Admit: 2020-10-21 | Discharge: 2020-10-21 | Disposition: A | Discharge status: Home / Self Care | Payer: Medicare Other | Attending: Urology | Admitting: Urology

## 2020-10-21 ENCOUNTER — Ambulatory Visit
Admission: RE | Admit: 2020-10-21 | Discharge: 2020-10-21 | Disposition: A | Discharge status: Home / Self Care | Payer: Medicare Other | Source: Ambulatory Visit | Attending: Urology | Admitting: Urology

## 2020-10-21 ENCOUNTER — Encounter: Payer: Self-pay | Admitting: Urology

## 2020-10-21 ENCOUNTER — Other Ambulatory Visit: Payer: Self-pay

## 2020-10-21 ENCOUNTER — Ambulatory Visit: Payer: Medicare Other | Admitting: Urology

## 2020-10-21 VITALS — BP 115/77 | HR 89 | Ht 70.0 in | Wt 206.0 lb

## 2020-10-21 DIAGNOSIS — N401 Enlarged prostate with lower urinary tract symptoms: Secondary | ICD-10-CM

## 2020-10-21 DIAGNOSIS — N200 Calculus of kidney: Secondary | ICD-10-CM | POA: Insufficient documentation

## 2020-10-21 DIAGNOSIS — R972 Elevated prostate specific antigen [PSA]: Secondary | ICD-10-CM | POA: Diagnosis not present

## 2020-10-21 NOTE — Progress Notes (Signed)
10/21/2020 1:52 PM   Christian Cline 07-23-1950 [id number]  Referring provider: [facility] [place],  [place] [address]  Chief Complaint  Patient presents with   Nephrolithiasis    Urologic history:   1.  BPH with lower urinary tract symptoms             -Previously on tamsulosin, discontinued 2021    2.  History elevated PSA             -12.2 03/2017; repeat PSAs have remained at baseline   3.  Nephrolithiasis             -Ureteroscopic removal 13 mm right UPJ stone 03/2018             -Nonobstructing left renal calculi             -Ureteroscopic removal 8 mm distal calculus 11/2018   4.  Erectile dysfunction             -On vardenafil  HPI: 70 y.o. male presents for annual follow-up.  No problems since last years visit Has mild daytime frequency and urgency and nocturia x3, some decreased force and caliber of his urinary stream.  States symptoms not bothersome enough that he wants to take medication Denies flank, abdominal, pelvic pain No dysuria or gross hematuria PSA 10/16/2019 was 4.1   PMH: Past Medical History:  Diagnosis Date   Acid reflux    Arthritis    Bell's palsy    COPD (chronic obstructive pulmonary disease) ([facility])    Gout    Heart murmur    History of kidney stones    Hyperlipidemia    Hypertension    PAF (paroxysmal atrial fibrillation) ([facility])    Sleep apnea    Vertigo    3-4 episodes.  last one over 1 yr ago   Wears dentures    Partial upper, full lower    Surgical History: Past Surgical History:  Procedure Laterality Date   CATARACT EXTRACTION W/PHACO Left 08/13/2019   Procedure: CATARACT EXTRACTION PHACO AND INTRAOCULAR LENS PLACEMENT (IOC) LEFT;  Surgeon: Birder Robson, MD;  Location: [facility];  Service: Ophthalmology;  Laterality: Left;  sleep apnea   COLONOSCOPY WITH PROPOFOL     CYSTOSCOPY W/ RETROGRADES Right 03/23/2018   Procedure: CYSTOSCOPY WITH RETROGRADE PYELOGRAM;  Surgeon:  Billey Co, MD;  Location: [facility];  Service: Urology;  Laterality: Right;   CYSTOSCOPY W/ RETROGRADES Right 11/23/2018   Procedure: CYSTOSCOPY WITH RETROGRADE PYELOGRAM;  Surgeon: Billey Co, MD;  Location: [facility];  Service: Urology;  Laterality: Right;   CYSTOSCOPY WITH STENT PLACEMENT Right 03/23/2018   Procedure: CYSTOSCOPY WITH STENT PLACEMENT;  Surgeon: Billey Co, MD;  Location: [facility];  Service: Urology;  Laterality: Right;   CYSTOSCOPY WITH URETHRAL DILATATION N/A 11/23/2018   Procedure: CYSTOSCOPY WITH URETHRAL DILATATION;  Surgeon: Billey Co, MD;  Location: [facility];  Service: Urology;  Laterality: N/A;   CYSTOSCOPY/URETEROSCOPY/HOLMIUM LASER/STENT PLACEMENT Right 04/06/2018   Procedure: CYSTOSCOPY/URETEROSCOPY/HOLMIUM LASER/STENT Exchange;  Surgeon: Billey Co, MD;  Location: [facility];  Service: Urology;  Laterality: Right;   CYSTOSCOPY/URETEROSCOPY/HOLMIUM LASER/STENT PLACEMENT Right 11/23/2018   Procedure: CYSTOSCOPY/URETEROSCOPY/HOLMIUM LASER/STENT PLACEMENT;  Surgeon: Billey Co, MD;  Location: [facility];  Service: Urology;  Laterality: Right;   KIDNEY STONE SURGERY     LASIK Bilateral    SHOULDER ARTHROSCOPY Right     Home Medications:  Allergies as of 10/21/2020  Reactions   Ciprofloxacin    Causes severe dizziness.        Medication List        Accurate as of October 21, 2020  1:52 PM. If you have any questions, ask your nurse or doctor.          albuterol 108 (90 Base) MCG/ACT inhaler Commonly known as: VENTOLIN HFA Inhale 2 puffs into the lungs every 6 (six) hours as needed for wheezing or shortness of breath.   allopurinol 300 MG tablet Commonly known as: ZYLOPRIM Take 300 mg by mouth daily.   aspirin EC 81 MG tablet Take 81 mg by mouth daily.   atorvastatin 40 MG tablet Commonly known as: LIPITOR Take 40 mg by mouth daily.   chlorthalidone 25 MG tablet Commonly known as: HYGROTON Take 25 mg by mouth  every morning.   docusate sodium 100 MG capsule Commonly known as: COLACE Take 100 mg by mouth daily.   Eliquis 5 MG Tabs tablet Generic drug: apixaban Take 5 mg by mouth 2 (two) times daily.   famotidine 20 MG tablet Commonly known as: PEPCID Take 20 mg by mouth 2 (two) times daily.   Fluticasone-Salmeterol 250-50 MCG/DOSE Aepb Commonly known as: Advair Diskus Inhale 1 puff into the lungs 2 (two) times daily.   quinapril 40 MG tablet Commonly known as: ACCUPRIL Take 40 mg by mouth daily.   vardenafil 20 MG tablet Commonly known as: LEVITRA 1 tab 60 minutes prior to intercourse   verapamil 360 MG 24 hr capsule Commonly known as: VERELAN PM Take 360 mg by mouth at bedtime.        Allergies:  Allergies  Allergen Reactions   Ciprofloxacin     Causes severe dizziness.    Family History: Family History  Problem Relation Age of Onset   Kidney cancer Brother    Kidney disease Neg Hx    Prostate cancer Neg Hx     Social History:  reports that he has been smoking cigarettes. He has a 50.00 pack-year smoking history. He has never used smokeless tobacco. He reports previous alcohol use. He reports that he does not use drugs.   Physical Exam: BP 115/77   Pulse 89   Ht '5\' 10"'$  (1.778 m)   Wt 206 lb (93.4 kg)   BMI 29.56 kg/m   Constitutional:  Alert and oriented, No acute distress. HEENT: [place] AT, moist mucus membranes.  Trachea midline, no masses. Cardiovascular: No clubbing, cyanosis, or edema. Respiratory: Normal respiratory effort, no increased work of breathing. GI: Abdomen is soft, nontender, nondistended, no abdominal masses GU: Prostate 50 g, smooth without nodules Skin: No rashes, bruises or suspicious lesions. Neurologic: Grossly intact, no focal deficits, moving all 4 extremities. Psychiatric: Normal mood and affect.   Assessment & Plan:    1.  BPH with LUTS Stable and not bothersome  2.  Nephrolithiasis Stable KUB ordered today and will call  with results  3.  Elevated PSA Benign DRE Repeat PSA today  Continue annual follow-up   [name], MD  [facility] [address], [place], [place] [address] [phone]

## 2020-10-22 ENCOUNTER — Other Ambulatory Visit: Payer: Self-pay | Admitting: Family Medicine

## 2020-10-22 ENCOUNTER — Telehealth: Payer: Self-pay | Admitting: Family Medicine

## 2020-10-22 DIAGNOSIS — R972 Elevated prostate specific antigen [PSA]: Secondary | ICD-10-CM

## 2020-10-22 LAB — PSA: Prostate Specific Ag, Serum: 4.6 ng/mL — ABNORMAL HIGH (ref 0.0–4.0)

## 2020-10-22 NOTE — Telephone Encounter (Signed)
-----   Message from [name], MD sent at 10/22/2020  7:25 AM EDT ----- PSA slightly higher 4.6.  Recommend 1-monthlab visit for PSA recheck

## 2020-10-22 NOTE — Telephone Encounter (Signed)
Patient notified and voiced understanding. Lab appointment made.

## 2020-10-23 ENCOUNTER — Telehealth: Payer: Self-pay

## 2020-10-23 NOTE — Telephone Encounter (Signed)
-----   Message from [name], MD sent at 10/22/2020  5:21 PM EDT ----- KUB with stable bilateral renal calculi

## 2020-10-23 NOTE — Telephone Encounter (Signed)
Patient notified

## 2021-02-24 ENCOUNTER — Other Ambulatory Visit: Payer: Self-pay | Admitting: Nephrology

## 2021-02-24 DIAGNOSIS — N184 Chronic kidney disease, stage 4 (severe): Secondary | ICD-10-CM

## 2021-02-24 DIAGNOSIS — R319 Hematuria, unspecified: Secondary | ICD-10-CM

## 2021-02-24 DIAGNOSIS — R809 Proteinuria, unspecified: Secondary | ICD-10-CM

## 2021-02-24 DIAGNOSIS — R8290 Unspecified abnormal findings in urine: Secondary | ICD-10-CM

## 2021-02-24 DIAGNOSIS — I129 Hypertensive chronic kidney disease with stage 1 through stage 4 chronic kidney disease, or unspecified chronic kidney disease: Secondary | ICD-10-CM

## 2021-03-01 ENCOUNTER — Ambulatory Visit
Admission: RE | Admit: 2021-03-01 | Discharge: 2021-03-01 | Disposition: A | Discharge status: Home / Self Care | Payer: Medicare Other | Source: Ambulatory Visit | Attending: Nephrology | Admitting: Nephrology

## 2021-03-01 ENCOUNTER — Other Ambulatory Visit: Payer: Self-pay

## 2021-03-01 DIAGNOSIS — R8290 Unspecified abnormal findings in urine: Secondary | ICD-10-CM | POA: Diagnosis not present

## 2021-03-01 DIAGNOSIS — R319 Hematuria, unspecified: Secondary | ICD-10-CM | POA: Diagnosis present

## 2021-03-01 DIAGNOSIS — R809 Proteinuria, unspecified: Secondary | ICD-10-CM | POA: Diagnosis present

## 2021-03-01 DIAGNOSIS — I129 Hypertensive chronic kidney disease with stage 1 through stage 4 chronic kidney disease, or unspecified chronic kidney disease: Secondary | ICD-10-CM | POA: Diagnosis present

## 2021-03-01 DIAGNOSIS — N184 Chronic kidney disease, stage 4 (severe): Secondary | ICD-10-CM | POA: Diagnosis present

## 2021-04-19 ENCOUNTER — Other Ambulatory Visit: Payer: Self-pay | Admitting: Gastroenterology

## 2021-04-19 ENCOUNTER — Other Ambulatory Visit (HOSPITAL_COMMUNITY): Payer: Self-pay | Admitting: Gastroenterology

## 2021-04-19 DIAGNOSIS — K2100 Gastro-esophageal reflux disease with esophagitis, without bleeding: Secondary | ICD-10-CM

## 2021-04-19 DIAGNOSIS — R634 Abnormal weight loss: Secondary | ICD-10-CM

## 2021-04-19 DIAGNOSIS — Z720 Tobacco use: Secondary | ICD-10-CM

## 2021-04-21 ENCOUNTER — Other Ambulatory Visit: Payer: Self-pay

## 2021-04-21 ENCOUNTER — Ambulatory Visit
Admission: RE | Admit: 2021-04-21 | Discharge: 2021-04-21 | Disposition: A | Discharge status: Home / Self Care | Payer: Medicare Other | Source: Ambulatory Visit | Attending: Gastroenterology | Admitting: Gastroenterology

## 2021-04-21 DIAGNOSIS — K2100 Gastro-esophageal reflux disease with esophagitis, without bleeding: Secondary | ICD-10-CM | POA: Diagnosis present

## 2021-04-21 DIAGNOSIS — R634 Abnormal weight loss: Secondary | ICD-10-CM | POA: Diagnosis present

## 2021-04-21 DIAGNOSIS — Z720 Tobacco use: Secondary | ICD-10-CM | POA: Diagnosis present

## 2021-04-27 ENCOUNTER — Other Ambulatory Visit: Payer: Medicare Other

## 2021-05-06 ENCOUNTER — Other Ambulatory Visit: Payer: Medicare Other

## 2021-05-06 ENCOUNTER — Other Ambulatory Visit: Payer: Self-pay

## 2021-05-06 DIAGNOSIS — R972 Elevated prostate specific antigen [PSA]: Secondary | ICD-10-CM

## 2021-05-07 ENCOUNTER — Telehealth: Payer: Self-pay | Admitting: *Deleted

## 2021-05-07 LAB — PSA: Prostate Specific Ag, Serum: 4.4 ng/mL — ABNORMAL HIGH (ref 0.0–4.0)

## 2021-05-07 NOTE — Telephone Encounter (Signed)
Notified patient as instructed, patient pleased. Discussed follow-up appointments, patient agrees  

## 2021-05-07 NOTE — Telephone Encounter (Signed)
-----   Message from [name], MD sent at 05/07/2021  7:33 AM EST ----- PSA stable at 4.4.  Follow-up as scheduled

## 2021-07-14 ENCOUNTER — Encounter: Admission: RE | Payer: Self-pay | Source: Home / Self Care

## 2021-07-14 ENCOUNTER — Ambulatory Visit: Admission: RE | Admit: 2021-07-14 | Payer: Medicare Other | Source: Home / Self Care | Admitting: Internal Medicine

## 2021-07-14 SURGERY — COLONOSCOPY WITH PROPOFOL
Anesthesia: General

## 2021-10-22 ENCOUNTER — Ambulatory Visit (INDEPENDENT_AMBULATORY_CARE_PROVIDER_SITE_OTHER): Payer: Medicare Other | Admitting: Urology

## 2021-10-22 ENCOUNTER — Encounter: Payer: Self-pay | Admitting: Urology

## 2021-10-22 VITALS — BP 136/77 | HR 76 | Ht 70.0 in | Wt 180.0 lb

## 2021-10-22 DIAGNOSIS — N401 Enlarged prostate with lower urinary tract symptoms: Secondary | ICD-10-CM | POA: Diagnosis not present

## 2021-10-22 DIAGNOSIS — R972 Elevated prostate specific antigen [PSA]: Secondary | ICD-10-CM | POA: Diagnosis not present

## 2021-10-22 DIAGNOSIS — N200 Calculus of kidney: Secondary | ICD-10-CM | POA: Diagnosis not present

## 2021-10-22 LAB — URINALYSIS, COMPLETE
Bilirubin, UA: NEGATIVE
Glucose, UA: NEGATIVE
Ketones, UA: NEGATIVE
Leukocytes,UA: NEGATIVE
Nitrite, UA: NEGATIVE
Specific Gravity, UA: 1.02 (ref 1.005–1.030)
Urobilinogen, Ur: 0.2 mg/dL (ref 0.2–1.0)
pH, UA: 5 (ref 5.0–7.5)

## 2021-10-22 LAB — BLADDER SCAN AMB NON-IMAGING: SCA Result: 5

## 2021-10-22 LAB — MICROSCOPIC EXAMINATION: Bacteria, UA: NONE SEEN

## 2021-10-22 NOTE — Progress Notes (Signed)
10/22/2021 10:15 AM   Christian Cline [birth year] [id number]  Referring provider: [facility] [place],  [place] [address]  Chief Complaint  Patient presents with   Benign Prostatic Hypertrophy    Urologic history:   1.  BPH with lower urinary tract symptoms             -Previously on tamsulosin, discontinued 2021    2.  History elevated PSA             -12.2 03/2017; repeat PSAs have remained at baseline   3.  Nephrolithiasis             -Ureteroscopic removal 13 mm right UPJ stone 03/2018             -Nonobstructing left renal calculi             -Ureteroscopic removal 8 mm distal calculus 11/2018   4.  Erectile dysfunction             -On vardenafil  HPI: 71 y.o. male presents for annual follow-up.  No bothersome LUTS; IPSS today 8/35 Denies flank, abdominal, pelvic pain No dysuria or gross hematuria PSA 04/2021 was 4.4 Had CT chest/abdomen/pelvis 04/21/2021 and had stable, bilateral nonobstructing renal calculi Followed by nephrology for stage III CKD   PMH: Past Medical History:  Diagnosis Date   Acid reflux    Arthritis    Bell's palsy    COPD (chronic obstructive pulmonary disease) (HCC)    Gout    Heart murmur    History of kidney stones    Hyperlipidemia    Hypertension    PAF (paroxysmal atrial fibrillation) (HCC)    Sleep apnea    Vertigo    3-4 episodes.  last one over 1 yr ago   Wears dentures    Partial upper, full lower    Surgical History: Past Surgical History:  Procedure Laterality Date   CATARACT EXTRACTION W/PHACO Left 08/13/2019   Procedure: CATARACT EXTRACTION PHACO AND INTRAOCULAR LENS PLACEMENT (IOC) LEFT;  Surgeon: Birder Robson, MD;  Location: [place];  Service: Ophthalmology;  Laterality: Left;  sleep apnea   COLONOSCOPY WITH PROPOFOL     CYSTOSCOPY W/ RETROGRADES Right 03/23/2018   Procedure: CYSTOSCOPY WITH RETROGRADE PYELOGRAM;  Surgeon: Billey Co, MD;  Location: [facility];  Service: Urology;  Laterality: Right;   CYSTOSCOPY W/ RETROGRADES Right 11/23/2018   Procedure: CYSTOSCOPY WITH RETROGRADE PYELOGRAM;  Surgeon: Billey Co, MD;  Location: [facility];  Service: Urology;  Laterality: Right;   CYSTOSCOPY WITH STENT PLACEMENT Right 03/23/2018   Procedure: CYSTOSCOPY WITH STENT PLACEMENT;  Surgeon: Billey Co, MD;  Location: [facility];  Service: Urology;  Laterality: Right;   CYSTOSCOPY WITH URETHRAL DILATATION N/A 11/23/2018   Procedure: CYSTOSCOPY WITH URETHRAL DILATATION;  Surgeon: Billey Co, MD;  Location: [facility];  Service: Urology;  Laterality: N/A;   CYSTOSCOPY/URETEROSCOPY/HOLMIUM LASER/STENT PLACEMENT Right 04/06/2018   Procedure: CYSTOSCOPY/URETEROSCOPY/HOLMIUM LASER/STENT Exchange;  Surgeon: Billey Co, MD;  Location: [facility];  Service: Urology;  Laterality: Right;   CYSTOSCOPY/URETEROSCOPY/HOLMIUM LASER/STENT PLACEMENT Right 11/23/2018   Procedure: CYSTOSCOPY/URETEROSCOPY/HOLMIUM LASER/STENT PLACEMENT;  Surgeon: Billey Co, MD;  Location: [facility];  Service: Urology;  Laterality: Right;   KIDNEY STONE SURGERY     LASIK Bilateral    SHOULDER ARTHROSCOPY Right     Home Medications:  Allergies as of 10/22/2021       Reactions   Ciprofloxacin  Causes severe dizziness.        Medication List        Accurate as of October 22, 2021 10:15 AM. If you have any questions, ask your nurse or doctor.          albuterol 108 (90 Base) MCG/ACT inhaler Commonly known as: VENTOLIN HFA Inhale 2 puffs into the lungs every 6 (six) hours as needed for wheezing or shortness of breath.   allopurinol 300 MG tablet Commonly known as: ZYLOPRIM Take 300 mg by mouth daily.   aspirin EC 81 MG tablet Take 81 mg by mouth daily.   atorvastatin 40 MG tablet Commonly known as: LIPITOR Take 40 mg by mouth daily.   chlorthalidone 25 MG tablet Commonly known as: HYGROTON Take 25 mg by mouth every morning.   docusate sodium 100 MG  capsule Commonly known as: COLACE Take 100 mg by mouth daily.   Eliquis 5 MG Tabs tablet Generic drug: apixaban Take 5 mg by mouth 2 (two) times daily.   famotidine 20 MG tablet Commonly known as: PEPCID Take 20 mg by mouth 2 (two) times daily.   Fluticasone-Salmeterol 250-50 MCG/DOSE Aepb Commonly known as: Advair Diskus Inhale 1 puff into the lungs 2 (two) times daily.   quinapril 40 MG tablet Commonly known as: ACCUPRIL Take 40 mg by mouth daily.   vardenafil 20 MG tablet Commonly known as: LEVITRA 1 tab 60 minutes prior to intercourse   verapamil 360 MG 24 hr capsule Commonly known as: VERELAN PM Take 360 mg by mouth at bedtime.        Allergies:  Allergies  Allergen Reactions   Ciprofloxacin     Causes severe dizziness.    Family History: Family History  Problem Relation Age of Onset   Kidney cancer Brother    Kidney disease Neg Hx    Prostate cancer Neg Hx     Social History:  reports that he has been smoking cigarettes. He has a 50.00 pack-year smoking history. He has never used smokeless tobacco. He reports that he does not currently use alcohol. He reports that he does not use drugs.   Physical Exam: BP 136/77   Pulse 76   Ht 5\' 10"  (1.778 m)   Wt 180 lb (81.6 kg)   BMI 25.83 kg/m   Constitutional:  Alert and oriented, No acute distress. HEENT: [place] AT Respiratory: Normal respiratory effort, no increased work of breathing. Skin: No rashes, bruises or suspicious lesions. Neurologic: Grossly intact, no focal deficits, moving all 4 extremities. Psychiatric: Normal mood and affect.   Assessment & Plan:    1.  BPH with LUTS Stable and not bothersome  2.  Nephrolithiasis Stable bilateral renal calculi on recent CT Follow-up 1 year with [facility]  3.  Elevated PSA Repeat PSA today  Continue annual follow-up   [name], MD  [facility] [address] [place], [place] [address] [phone]

## 2021-10-23 LAB — PSA: Prostate Specific Ag, Serum: 3.6 ng/mL (ref 0.0–4.0)

## 2021-10-25 ENCOUNTER — Telehealth: Payer: Self-pay | Admitting: *Deleted

## 2021-10-25 NOTE — Telephone Encounter (Signed)
Left message on phone per [facility]

## 2021-10-25 NOTE — Telephone Encounter (Signed)
-----   Message from [name], MD sent at 10/24/2021 11:09 AM EDT ----- PSA in normal range at 3.6.  Follow-up as scheduled

## 2022-01-15 ENCOUNTER — Ambulatory Visit
Admission: RE | Admit: 2022-01-15 | Discharge: 2022-01-15 | Disposition: A | Discharge status: Home / Self Care | Payer: Medicare Other | Source: Ambulatory Visit

## 2022-01-15 VITALS — BP 130/80 | HR 80 | Temp 99.0°F | Resp 15 | Ht 69.0 in | Wt 178.0 lb

## 2022-01-15 DIAGNOSIS — U071 COVID-19: Secondary | ICD-10-CM | POA: Diagnosis not present

## 2022-01-15 MED ORDER — PROMETHAZINE-DM 6.25-15 MG/5ML PO SYRP: 5.0000 mL | ORAL_SOLUTION | Freq: Four times a day (QID) | ORAL | 0 refills | Status: AC | PRN

## 2022-01-15 MED ORDER — MOLNUPIRAVIR EUA 200MG CAPSULE: 4.0000 | ORAL_CAPSULE | Freq: Two times a day (BID) | ORAL | 0 refills | Status: AC

## 2022-01-15 MED ORDER — BENZONATATE 100 MG PO CAPS: 200.0000 mg | ORAL_CAPSULE | Freq: Three times a day (TID) | ORAL | 0 refills | Status: AC

## 2022-01-15 MED ORDER — IPRATROPIUM BROMIDE 0.06 % NA SOLN: 2.0000 | Freq: Four times a day (QID) | NASAL | 12 refills | Status: AC

## 2022-01-15 NOTE — Discharge Instructions (Signed)
You will have to quarantine for 5 days from the start of your symptoms.  After 5 days you can break quarantine if your symptoms have improved and you have not had a fever for 24 hours without taking Tylenol. You must mask around others for an additional 5 days.  Use over-the-counter Tylenol as needed for body aches and fever.  Use the Atrovent nasal spray, 2 squirts in each nostril every 6 hours, as needed for runny nose and postnasal drip.  Use the Tessalon Perles every 8 hours during the day.  Take them with a small sip of water.  They may give you some numbness to the base of your tongue or a metallic taste in your mouth, this is normal.  Use the Promethazine DM cough syrup at bedtime for cough and congestion.  It will make you drowsy so do not take it during the day.  Take the molnupiravir twice daily for 5 days for treatment of COVID-19.  If you develop any increased shortness of breath-especially at rest, you are unable to speak in full sentences, or is a late sign your lips are turning blue you need to go the ER for evaluation.

## 2022-01-15 NOTE — ED Triage Notes (Signed)
Patient states that he took a home COVID test last night and it was positive.  Patient c/o nasal congestion, chills, and bodyaches that started 2 days ago.  Patient reports fever last night.

## 2022-01-15 NOTE — ED Provider Notes (Signed)
[facility]    CSN: [id number] Arrival date & time: 01/15/22  1138      History   Chief Complaint Chief Complaint  Patient presents with   Covid Positive    Appointment    HPI Christian Cline is a 71 y.o. male.   HPI  71 year old male here for evaluation after testing COVID-positive.  The patient reports that he has been experiencing subjective fever, sweats, cold chills, nasal congestion with clear nasal discharge, and body aches for the last 2 days.  He took 2 home COVID test that were both positive.  He denies any shortness of breath or wheezing.  He does have significant past medical history to include acute respiratory failure, obstructive sleep apnea, essential hypertension, aortic insufficiency, and hyperlipidemia.  Past Medical History:  Diagnosis Date   Acid reflux    Arthritis    Bell's palsy    COPD (chronic obstructive pulmonary disease) (HCC)    Gout    Heart murmur    History of kidney stones    Hyperlipidemia    Hypertension    PAF (paroxysmal atrial fibrillation) (HCC)    Sleep apnea    Vertigo    3-4 episodes.  last one over 1 yr ago   Wears dentures    Partial upper, full lower    Patient Active Problem List   Diagnosis Date Noted   Benign prostatic hyperplasia with lower urinary tract symptoms 10/12/2018   Nephrolithiasis 10/12/2018   Aortic insufficiency 06/18/2018   Hyperlipidemia 06/18/2018   Tobacco use 06/18/2018   History of elevated PSA 10/13/2017   Erectile dysfunction due to arterial insufficiency 10/13/2017   Acute respiratory failure ([place]) 11/11/2016   Essential hypertension 04/13/2015   Obstructive sleep apnea 06/10/2014   Paroxysmal A-fib ([place]) 06/10/2014    Past Surgical History:  Procedure Laterality Date   CATARACT EXTRACTION W/PHACO Left 08/13/2019   Procedure: CATARACT EXTRACTION PHACO AND INTRAOCULAR LENS PLACEMENT ([place]) LEFT;  Surgeon: Birder Robson, MD;  Location: [place];  Service:  Ophthalmology;  Laterality: Left;  sleep apnea   COLONOSCOPY WITH PROPOFOL     CYSTOSCOPY W/ RETROGRADES Right 03/23/2018   Procedure: CYSTOSCOPY WITH RETROGRADE PYELOGRAM;  Surgeon: Billey Co, MD;  Location: [facility];  Service: Urology;  Laterality: Right;   CYSTOSCOPY W/ RETROGRADES Right 11/23/2018   Procedure: CYSTOSCOPY WITH RETROGRADE PYELOGRAM;  Surgeon: Billey Co, MD;  Location: [facility];  Service: Urology;  Laterality: Right;   CYSTOSCOPY WITH STENT PLACEMENT Right 03/23/2018   Procedure: CYSTOSCOPY WITH STENT PLACEMENT;  Surgeon: Billey Co, MD;  Location: [facility];  Service: Urology;  Laterality: Right;   CYSTOSCOPY WITH URETHRAL DILATATION N/A 11/23/2018   Procedure: CYSTOSCOPY WITH URETHRAL DILATATION;  Surgeon: Billey Co, MD;  Location: [facility];  Service: Urology;  Laterality: N/A;   CYSTOSCOPY/URETEROSCOPY/HOLMIUM LASER/STENT PLACEMENT Right 04/06/2018   Procedure: CYSTOSCOPY/URETEROSCOPY/HOLMIUM LASER/STENT Exchange;  Surgeon: Billey Co, MD;  Location: [facility];  Service: Urology;  Laterality: Right;   CYSTOSCOPY/URETEROSCOPY/HOLMIUM LASER/STENT PLACEMENT Right 11/23/2018   Procedure: CYSTOSCOPY/URETEROSCOPY/HOLMIUM LASER/STENT PLACEMENT;  Surgeon: Billey Co, MD;  Location: [facility];  Service: Urology;  Laterality: Right;   KIDNEY STONE SURGERY     LASIK Bilateral    SHOULDER ARTHROSCOPY Right        Home Medications    Prior to Admission medications   Medication Sig Start Date End Date Taking? Authorizing Provider  allopurinol (ZYLOPRIM) 300 MG tablet Take 300 mg by mouth daily.  09/30/16  Yes [provider]  aspirin EC 81 MG tablet Take 81 mg by mouth daily.  09/16/15  Yes [provider]  atorvastatin (LIPITOR) 40 MG tablet Take 40 mg by mouth daily.    Yes [provider]  benzonatate (TESSALON) 100 MG capsule Take 2 capsules (200 mg total) by mouth every 8 (eight) hours. 01/15/22  Yes Margarette Canada, NP   chlorthalidone (HYGROTON) 25 MG tablet Take 25 mg by mouth every morning.    Yes [provider]  ELIQUIS 5 MG TABS tablet Take 5 mg by mouth 2 (two) times daily.  05/15/18  Yes [provider]  Fluticasone-Salmeterol (ADVAIR DISKUS) 250-50 MCG/DOSE AEPB Inhale 1 puff into the lungs 2 (two) times daily. 11/14/16 01/15/22 Yes Demetrios Loll, MD  ipratropium (ATROVENT) 0.06 % nasal spray Place 2 sprays into both nostrils 4 (four) times daily. 01/15/22  Yes Margarette Canada, NP  molnupiravir EUA (LAGEVRIO) 200 mg CAPS capsule Take 4 capsules (800 mg total) by mouth 2 (two) times daily for 5 days. 01/15/22 01/20/22 Yes Margarette Canada, NP  promethazine-dextromethorphan (PROMETHAZINE-DM) 6.25-15 MG/5ML syrup Take 5 mLs by mouth 4 (four) times daily as needed. 01/15/22  Yes Margarette Canada, NP  verapamil (VERELAN PM) 360 MG 24 hr capsule Take 360 mg by mouth at bedtime.  08/10/16  Yes [provider]  albuterol (VENTOLIN HFA) 108 (90 Base) MCG/ACT inhaler Inhale 2 puffs into the lungs every 6 (six) hours as needed for wheezing or shortness of breath.  08/04/17   [provider]  docusate sodium (COLACE) 100 MG capsule Take 100 mg by mouth daily.    [provider]  enalapril (VASOTEC) 10 MG tablet Take 10 mg by mouth daily.    [provider]  famotidine (PEPCID) 20 MG tablet Take 20 mg by mouth 2 (two) times daily.    [provider]  vardenafil (LEVITRA) 20 MG tablet 1 tab 60 minutes prior to intercourse 10/11/17   Stoioff, Ronda Fairly, MD    Family History Family History  Problem Relation Age of Onset   Kidney cancer Brother    Kidney disease Neg Hx    Prostate cancer Neg Hx     Social History Social History   Tobacco Use   Smoking status: Every Day    Packs/day: 1.00    Years: 50.00    Total pack years: 50.00    Types: Cigarettes   Smokeless tobacco: Never  Vaping Use   Vaping Use: Never used  Substance Use Topics   Alcohol use: Not Currently    Drug use: No     Allergies   Ciprofloxacin   Review of Systems Review of Systems  Constitutional:  Positive for chills, diaphoresis and fever.  HENT:  Positive for congestion and rhinorrhea. Negative for ear pain and sore throat.   Respiratory:  Positive for cough. Negative for shortness of breath and wheezing.      Physical Exam Triage Vital Signs ED Triage Vitals  Enc Vitals Group     BP 01/15/22 1153 130/80     Pulse Rate 01/15/22 1153 80     Resp 01/15/22 1153 15     Temp 01/15/22 1153 99 F (37.2 C)     Temp Source 01/15/22 1153 Oral     SpO2 01/15/22 1153 98 %     Weight 01/15/22 1148 178 lb (80.7 kg)     Height 01/15/22 1148 5\' 9"  (1.753 m)     Head Circumference --  Peak Flow --      Pain Score 01/15/22 1148 0     Pain Loc --      Pain Edu? --      Excl. in [place]? --    No data found.  Updated Vital Signs BP 130/80 (BP Location: Left Arm)   Pulse 80   Temp 99 F (37.2 C) (Oral)   Resp 15   Ht 5\' 9"  (1.753 m)   Wt 178 lb (80.7 kg)   SpO2 98%   BMI 26.29 kg/m   Visual Acuity Right Eye Distance:   Left Eye Distance:   Bilateral Distance:    Right Eye Near:   Left Eye Near:    Bilateral Near:     Physical Exam Vitals and nursing note reviewed.  Constitutional:      Appearance: Normal appearance. He is not ill-appearing.  HENT:     Head: Normocephalic and atraumatic.     Right Ear: Tympanic membrane, ear canal and external ear normal. There is no impacted cerumen.     Left Ear: Tympanic membrane, ear canal and external ear normal. There is no impacted cerumen.     Nose: Congestion and rhinorrhea present.  Neurological:     Mental Status: He is alert.      UC Treatments / Results  Labs (all labs ordered are listed, but only abnormal results are displayed) Labs Reviewed - No data to display  EKG   Radiology No results found.  Procedures Procedures (including critical care time)  Medications Ordered in UC Medications - No data  to display  Initial Impression / Assessment and Plan / UC Course  I have reviewed the triage vital signs and the nursing notes.  Pertinent labs & imaging results that were available during my care of the patient were reviewed by me and considered in my medical decision making (see chart for details).   Patient is a pleasant, nontoxic-appearing 71 year old male who is COVID-positive but is significant past medical history including renal disease with a GFR of 29 as of 11/17/2021.  He does not display any respiratory distress and he is able speak in full sentence without dyspnea or tachypnea.  Given his significant past medical history he does qualify for antiviral therapy so I will start him on molnupiravir twice daily for 5 days for treatment of COVID-19 along with Atrovent nasal spray to help with nasal congestion, Tessalon Perles help with cough during the day, and Promethazine DM cough syrup to help at bedtime.  I have discussed ER return precautions with the patient.   Final Clinical Impressions(s) / UC Diagnoses   Final diagnoses:  [id number]     Discharge Instructions      You will have to quarantine for 5 days from the start of your symptoms.  After 5 days you can break quarantine if your symptoms have improved and you have not had a fever for 24 hours without taking Tylenol. You must mask around others for an additional 5 days.  Use over-the-counter Tylenol as needed for body aches and fever.  Use the Atrovent nasal spray, 2 squirts in each nostril every 6 hours, as needed for runny nose and postnasal drip.  Use the Tessalon Perles every 8 hours during the day.  Take them with a small sip of water.  They may give you some numbness to the base of your tongue or a metallic taste in your mouth, this is normal.  Use the Promethazine DM cough syrup at bedtime  for cough and congestion.  It will make you drowsy so do not take it during the day.  Take the molnupiravir twice daily for 5 days  for treatment of COVID-19.  If you develop any increased shortness of breath-especially at rest, you are unable to speak in full sentences, or is a late sign your lips are turning blue you need to go the ER for evaluation.      ED Prescriptions     Medication Sig Dispense Auth. Provider   benzonatate (TESSALON) 100 MG capsule Take 2 capsules (200 mg total) by mouth every 8 (eight) hours. 21 capsule Margarette Canada, NP   ipratropium (ATROVENT) 0.06 % nasal spray Place 2 sprays into both nostrils 4 (four) times daily. 15 mL Margarette Canada, NP   molnupiravir EUA (LAGEVRIO) 200 mg CAPS capsule Take 4 capsules (800 mg total) by mouth 2 (two) times daily for 5 days. 40 capsule Margarette Canada, NP   promethazine-dextromethorphan (PROMETHAZINE-DM) 6.25-15 MG/5ML syrup Take 5 mLs by mouth 4 (four) times daily as needed. 118 mL Margarette Canada, NP      PDMP not reviewed this encounter.   Margarette Canada, NP 01/15/22 1221

## 2022-02-25 IMAGING — US US RENAL
1 series · 14 of 25 positions shown · non-contrast
Comparison: 01/02/2019

CLINICAL DATA: Abnormal urinalysis, microscopic hematuria
proteinuria

EXAM:
RENAL / URINARY TRACT ULTRASOUND COMPLETE

[Series 1: us renal · 0.23mm/px · 14 of 67 slices shown]
[im 1/67]
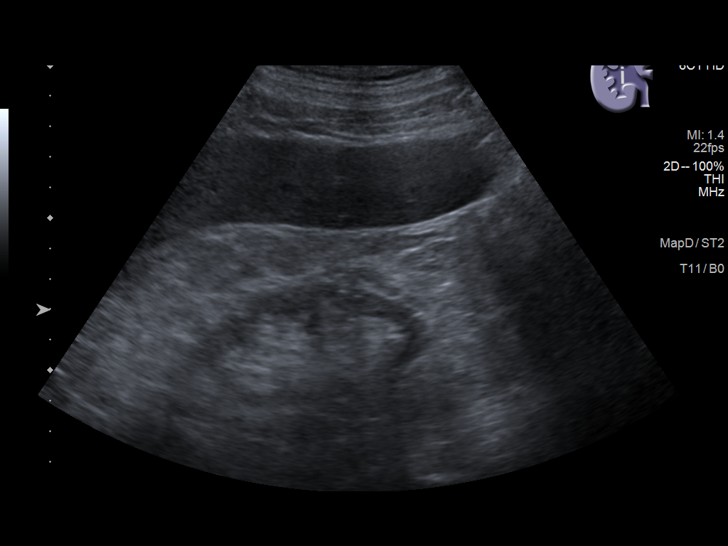
[im 6/67]
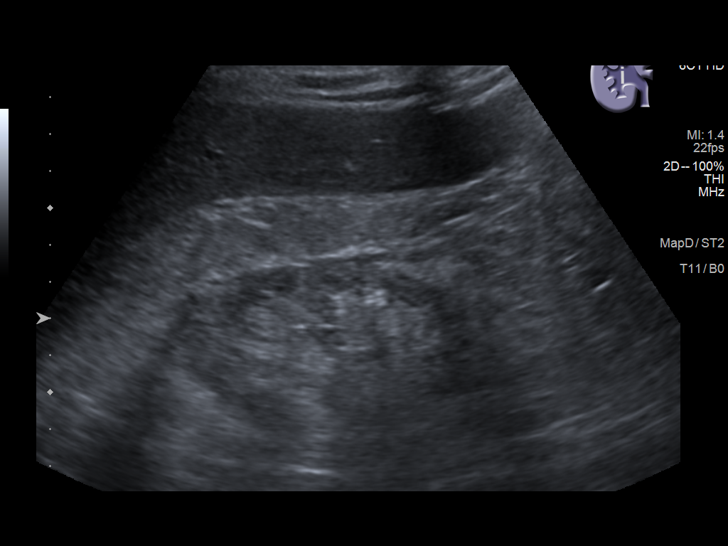
[im 12/67]
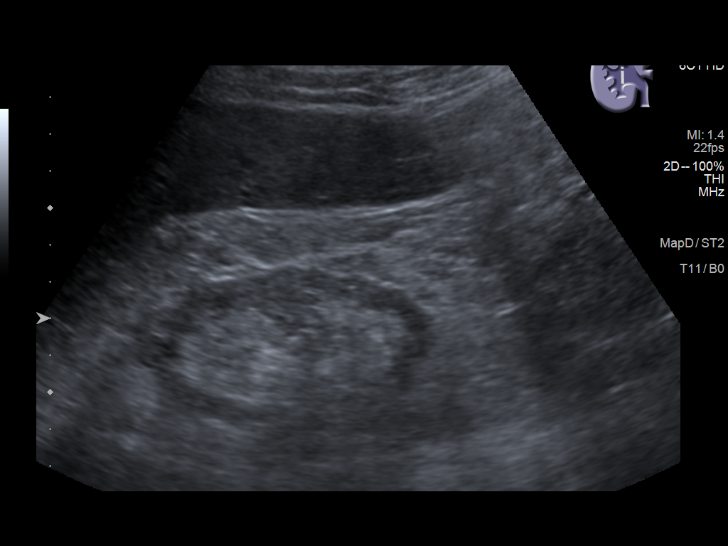
[im 17/67]
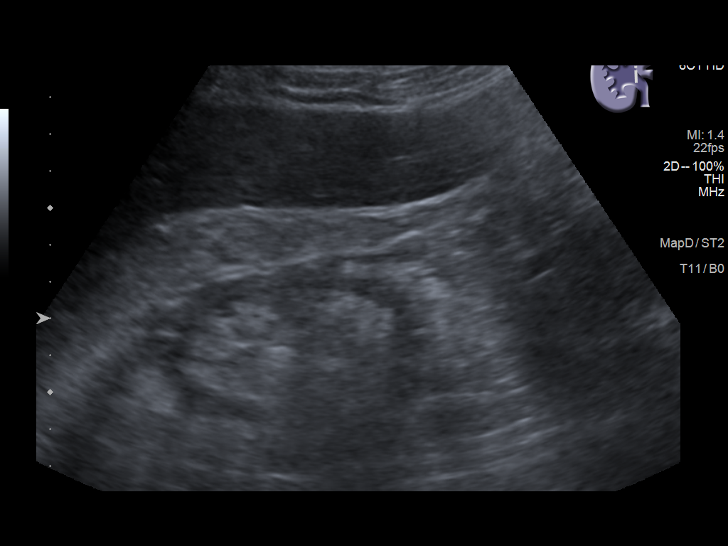
[im 23/67]
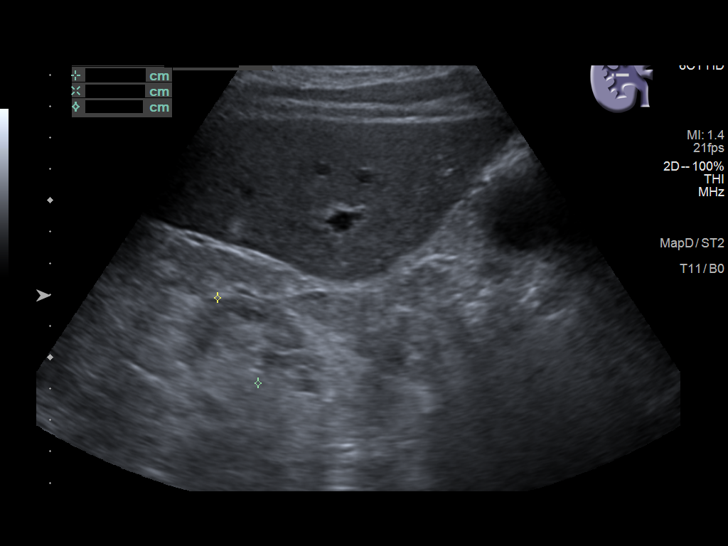
[im 25/67]
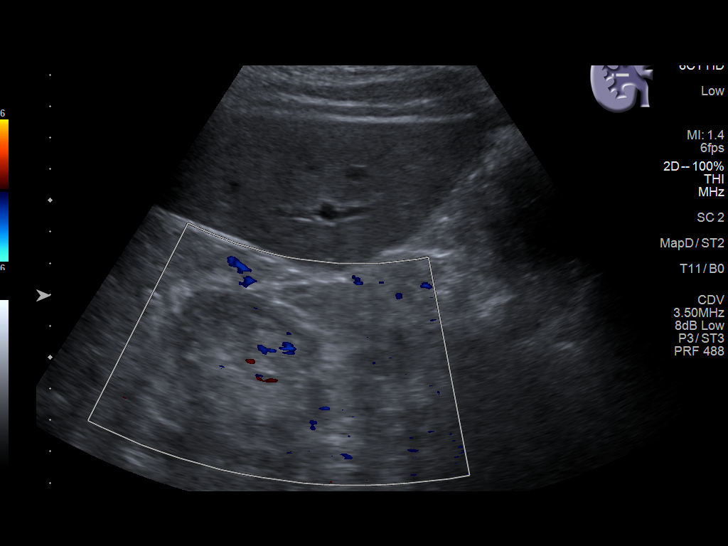
[im 31/67]
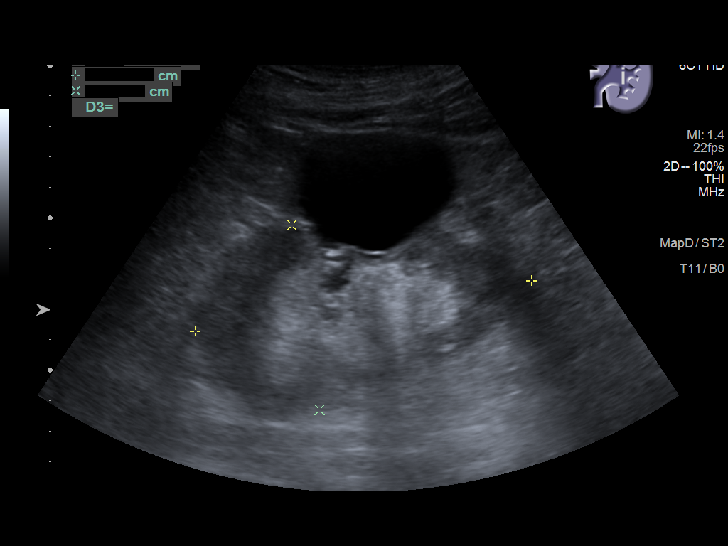
[im 36/67]
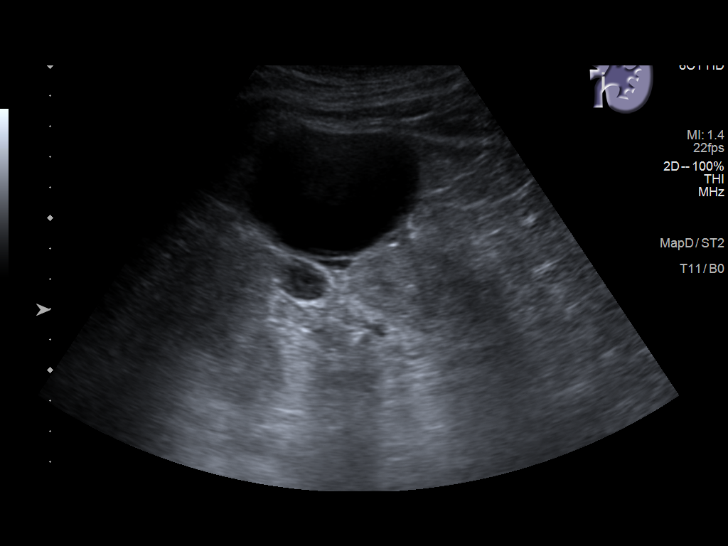
[im 42/67]
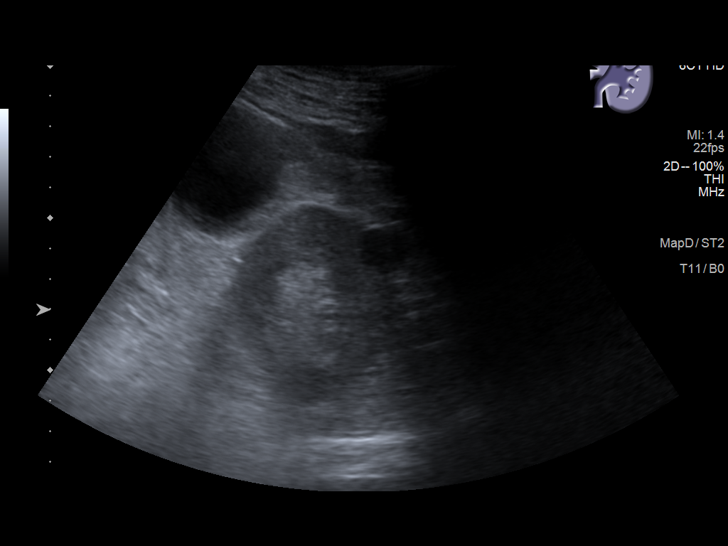
[im 45/67]
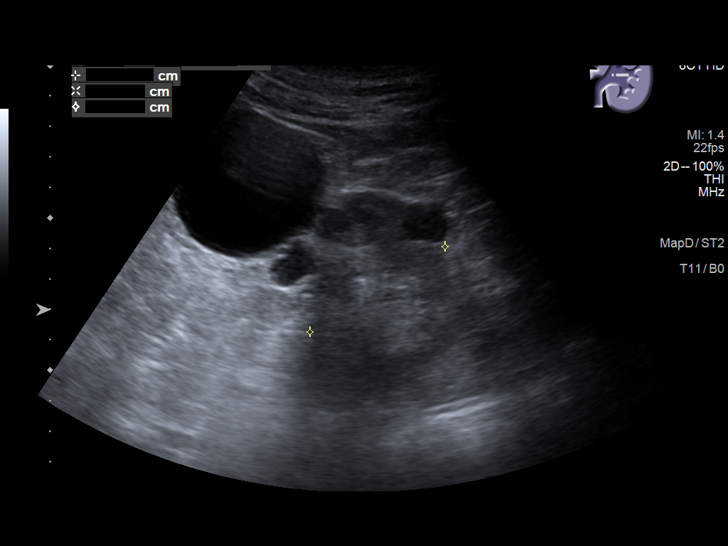
[im 50/67]
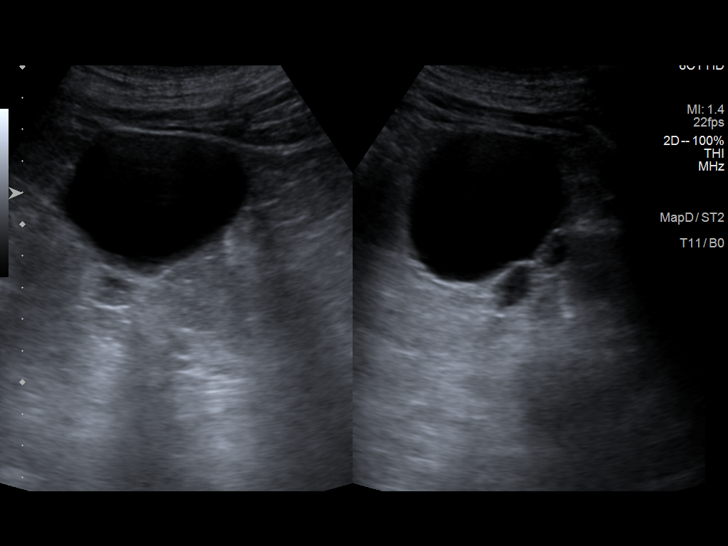
[im 56/67]
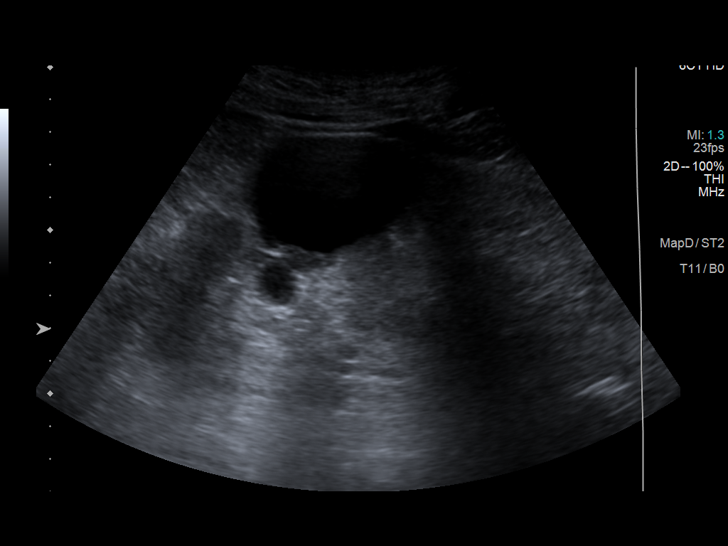
[im 61/67]
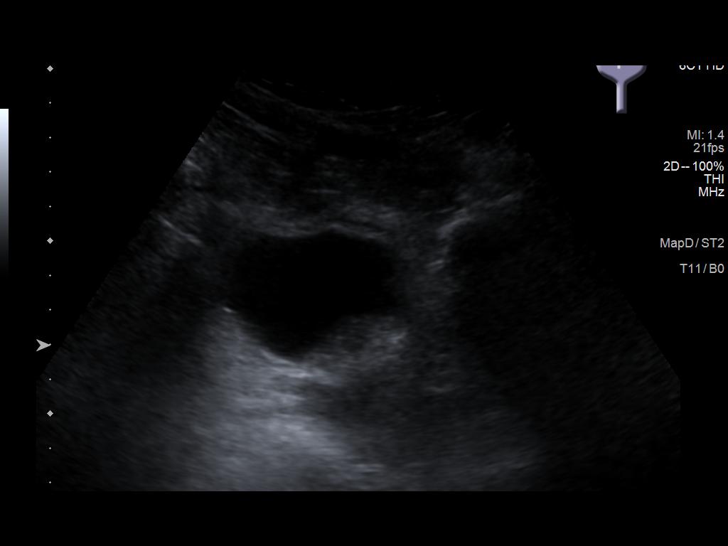
[im 67/67]
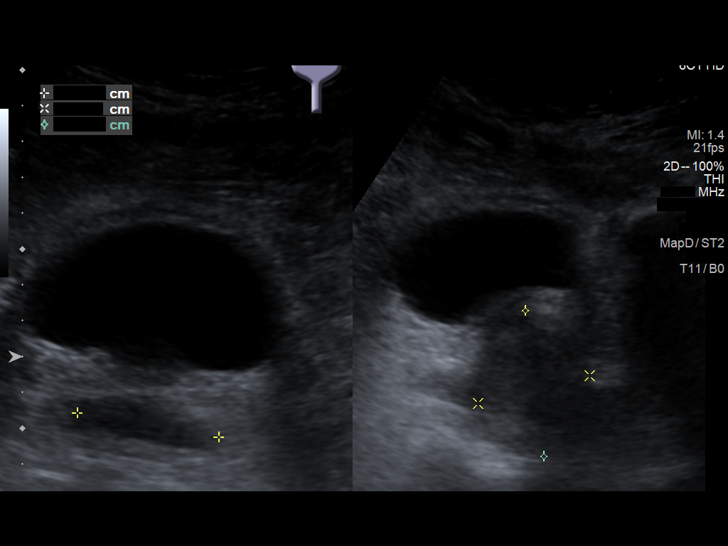

[14 of 25 positions shown; findings below may reference images not displayed]

FINDINGS: Right Kidney:

Renal measurements: 8.7 x 4.3 by 3.0 cm = volume: 58.8 mL. There is
increased renal cortical echotexture, with diffuse renal cortical
thinning. No hydronephrosis or renal mass.

Left Kidney:

Renal measurements: 11.2 x 6.1 by 5.2 cm = volume: 187.7 mL.
Increased renal cortical echotexture. Multiple cortical cysts,
measuring up to 6.3 cm in size. No hydronephrosis.

Bladder:

Appears normal for degree of bladder distention.

Other:

Incidental 1.0 x 0.8 x 0.7 cm echogenic focus within the inferior
right lobe liver likely a small hemangioma.
IMPRESSION: 1. Bilateral increased renal cortical echotexture consistent with
medical renal disease. Bilateral renal cortical atrophy, right
greater than left.
2. Multiple simple left renal cysts.
3. Incidental 1 cm hyperechoic focus right lobe liver, likely a
small hemangioma.

## 2022-04-17 IMAGING — CT CT CHEST-ABD-PELV W/O CM
2 of 4 series · 13 of 36 positions shown, 15 images · non-contrast
Comparison: 11/05/2018 CT, chest x-ray from 02/15/2021

CLINICAL DATA: Unintentional weight loss, history of tobacco abuse,
initial encounter



[Series 2: axials cap 5.00 · axial · 0.74mm/px · z∈[-1531,-961]mm · 10 of 138 slices shown, 12 images]
[im 12/138  mediastinal]
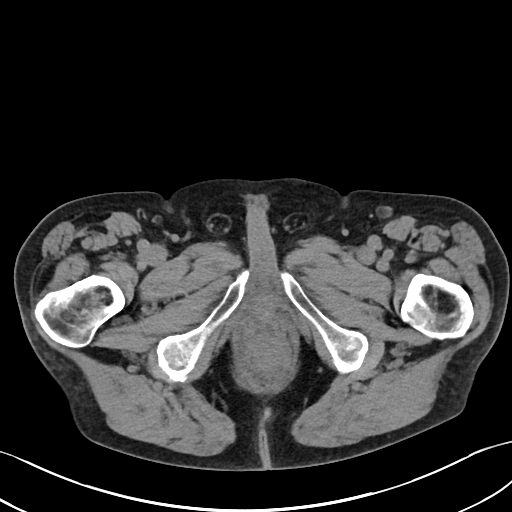
[im 12/138  bone]
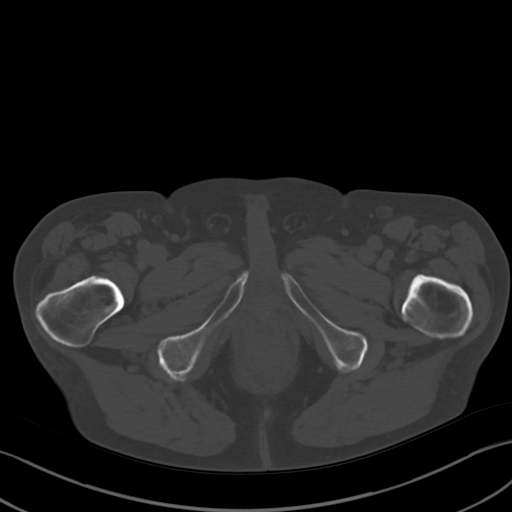
[im 23/138  mediastinal]
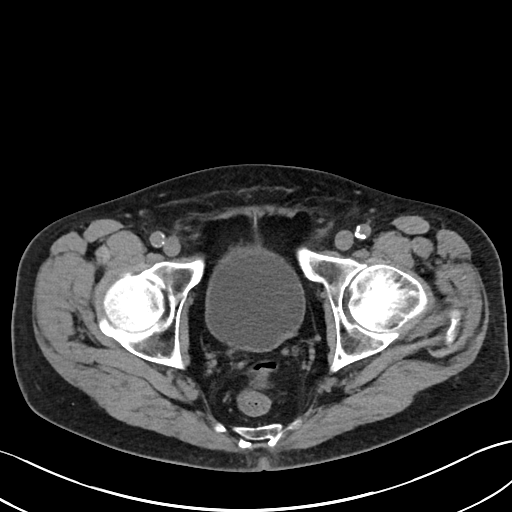
[im 35/138  mediastinal]
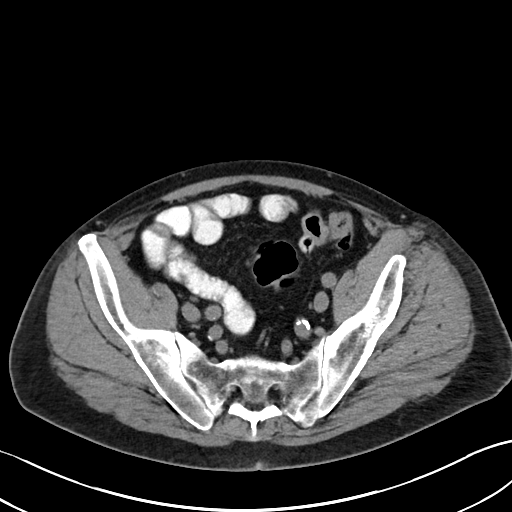
[im 46/138  mediastinal]
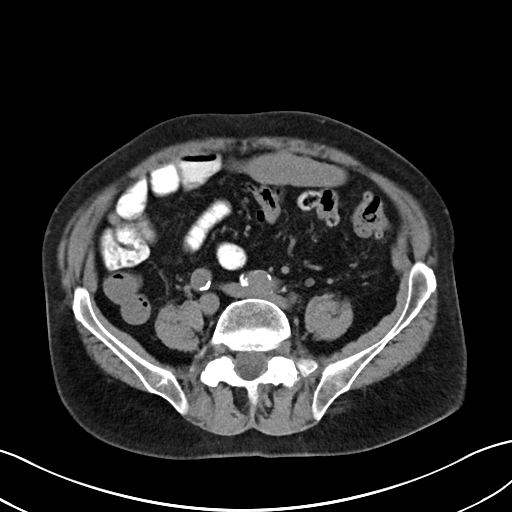
[im 58/138  mediastinal]
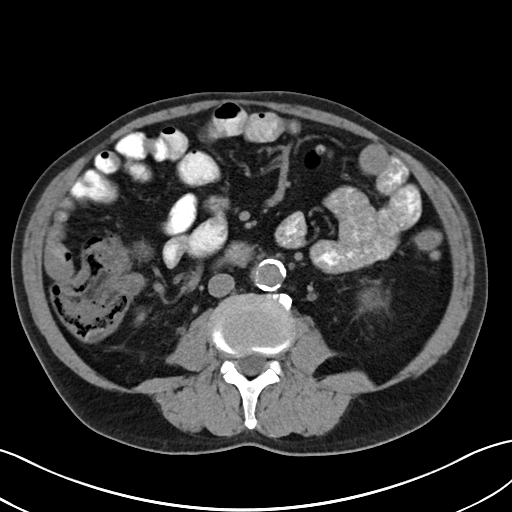
[im 80/138  mediastinal]
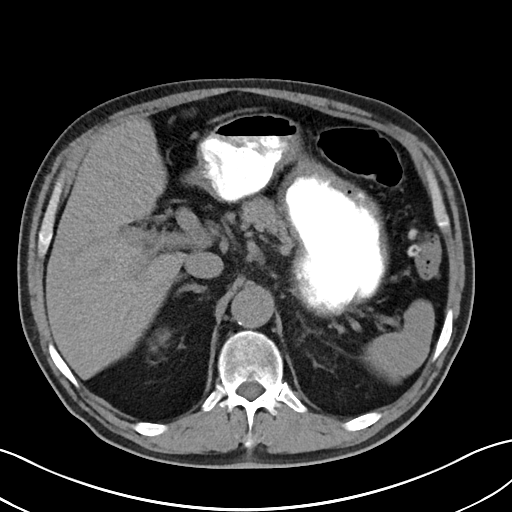
[im 92/138  mediastinal]
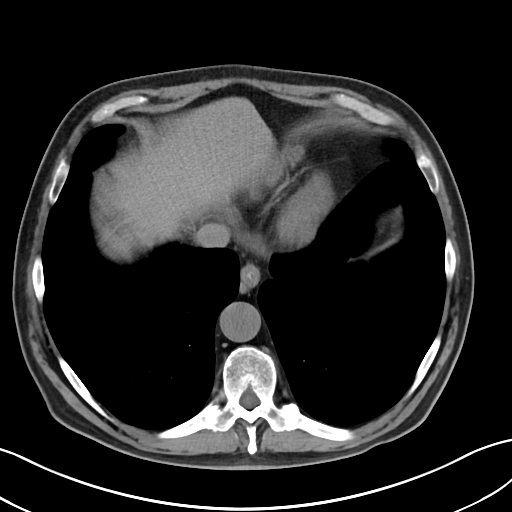
[im 103/138  mediastinal]
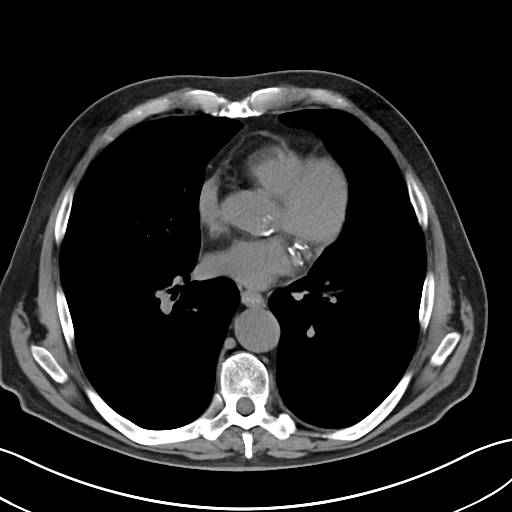
[im 115/138  mediastinal]
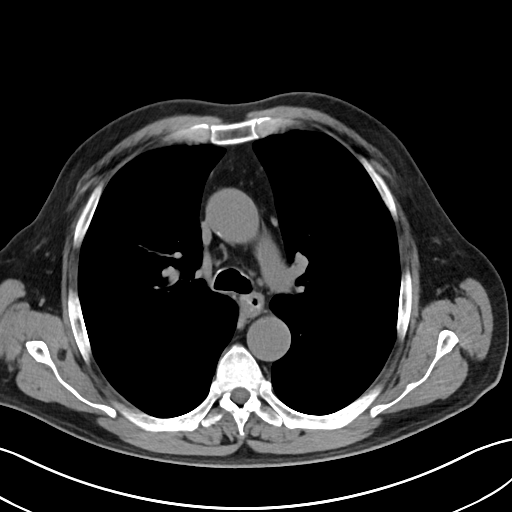
[im 115/138  bone]
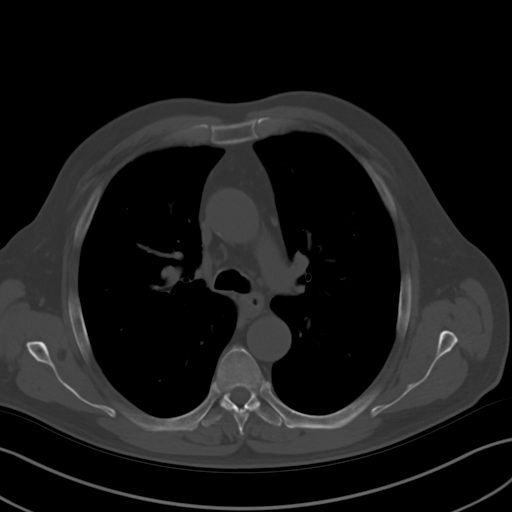
[im 126/138  mediastinal]
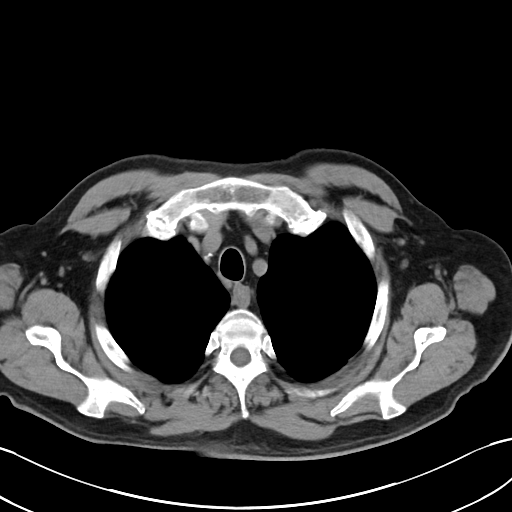

[Series 4: coronals cap 2.00 cor · coronal · 0.74mm/px · 3 of 159 slices shown]
[im 32/159  mediastinal]
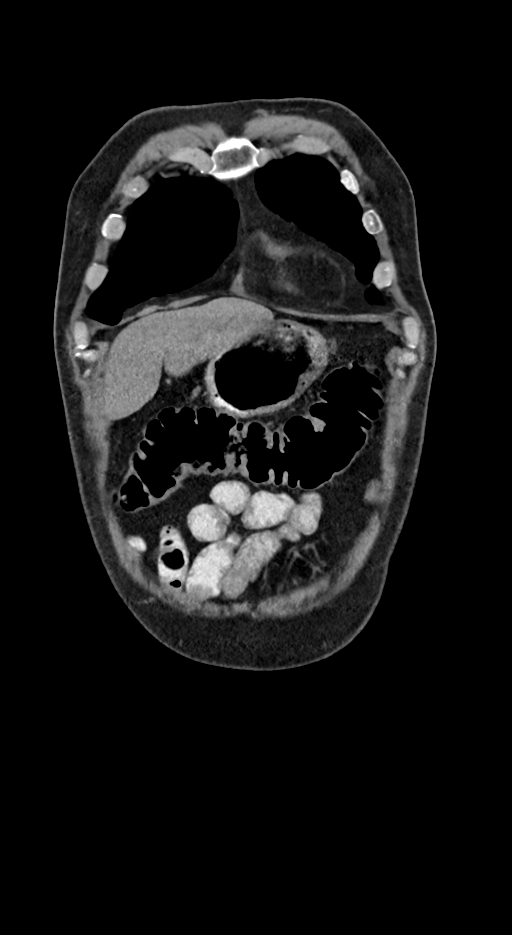
[im 64/159  mediastinal]
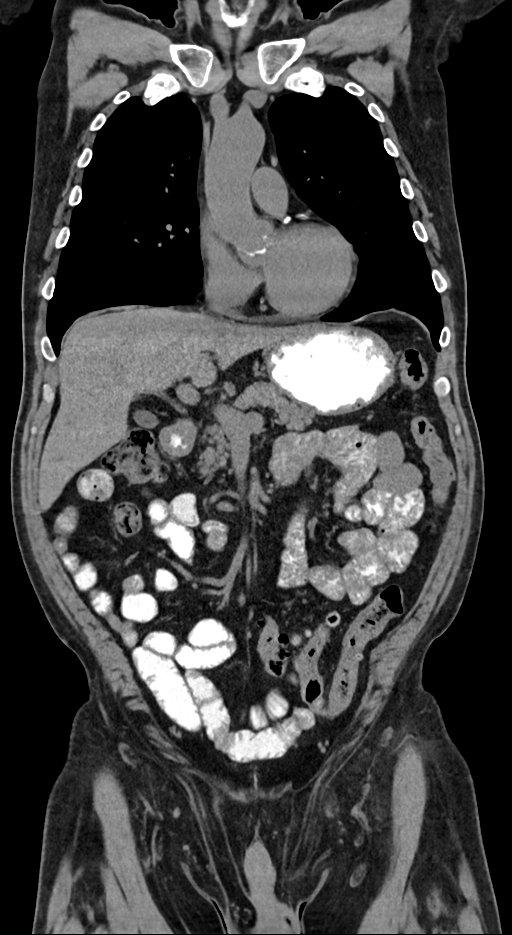
[im 95/159  mediastinal]
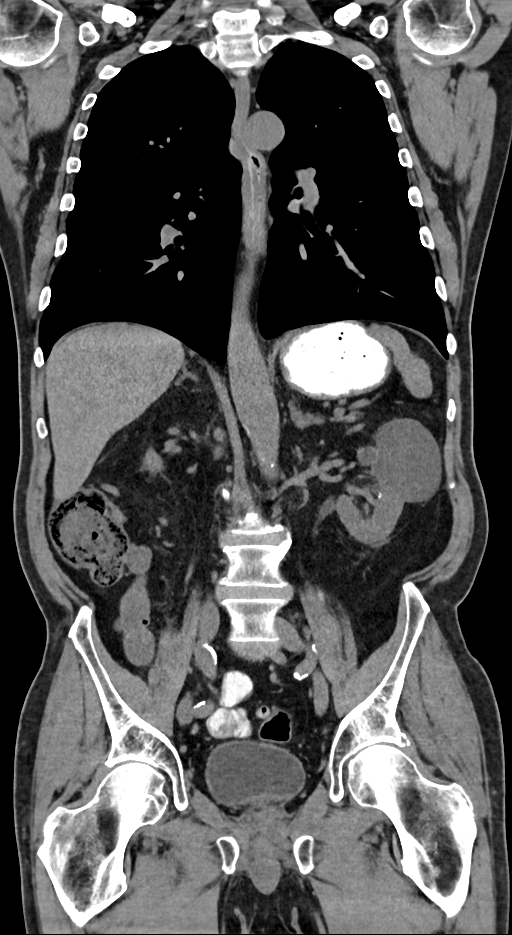

[13 of 36 positions shown; findings below may reference images not displayed]

FINDINGS: CT CHEST FINDINGS

Cardiovascular: Somewhat limited due to lack of IV contrast.
Atherosclerotic calcifications of the thoracic aorta are noted
without aneurysmal dilatation. Coronary calcifications are seen. No
cardiac enlargement is noted.

Mediastinum/Nodes: Thoracic inlet is within normal limits. No
sizable hilar or mediastinal adenopathy is noted. The esophagus
appears within normal limits.

Lungs/Pleura: Diffuse emphysematous changes are noted. No focal
infiltrate or sizable parenchymal nodule is noted. No pleural
effusion is seen.

Musculoskeletal: Mild degenerative changes of the thoracic spine are
noted. No acute rib abnormality is noted.

CT ABDOMEN PELVIS FINDINGS

Hepatobiliary: No focal liver abnormality is seen. No gallstones,
gallbladder wall thickening, or biliary dilatation.

Pancreas: Unremarkable. No pancreatic ductal dilatation or
surrounding inflammatory changes.

Spleen: Normal in size without focal abnormality.

Adrenals/Urinary Tract: Adrenal glands are within normal limits.
Right kidney is somewhat shrunken when compare with the prior exam.
5 mm lower pole nonobstructing right renal stone is seen stable in
appearance from the prior exam. Scattered cystic changes are noted
within the left kidney. Multiple nonobstructing renal calculi are
noted peripherally which are stable in appearance from the prior
exam. Left ureter is within normal limits. Bladder is partially
distended.

Stomach/Bowel: Scattered diverticular change of the colon is noted
without evidence of diverticulitis. The appendix is within normal
limits. No definitive colonic mass is seen. Small bowel and stomach
appear within normal limits.

Vascular/Lymphatic: Aortic atherosclerosis. No enlarged abdominal or
pelvic lymph nodes.

Reproductive: Prostate is prominent indenting upon the inferior
aspect of the urinary bladder

Other: No abdominal wall hernia or abnormality. No abdominopelvic
ascites.

Musculoskeletal: Degenerative changes of lumbar spine are noted.
IMPRESSION: CT of the chest: Mild emphysematous changes are noted without acute
abnormality.

CT of the abdomen and pelvis: Bilateral nonobstructing renal calculi
left greater than right stable in appearance from the prior exam.

Prominent prostate indenting upon the inferior aspect of the
bladder.

Diverticulosis without diverticulitis.

Aortic Atherosclerosis (C5LVW-N8S.S) and Emphysema (C5LVW-LC3.J).

## 2022-10-28 ENCOUNTER — Inpatient Hospital Stay: Admit: 2022-10-28 | Payer: Medicare Other

## 2022-10-28 ENCOUNTER — Other Ambulatory Visit: Payer: Self-pay | Admitting: *Deleted

## 2022-10-28 ENCOUNTER — Ambulatory Visit
Admission: RE | Admit: 2022-10-28 | Discharge: 2022-10-28 | Disposition: A | Discharge status: Home / Self Care | Payer: Medicare Other | Attending: Urology | Admitting: Urology

## 2022-10-28 ENCOUNTER — Encounter: Payer: Self-pay | Admitting: Urology

## 2022-10-28 ENCOUNTER — Ambulatory Visit
Admission: RE | Admit: 2022-10-28 | Discharge: 2022-10-28 | Disposition: A | Discharge status: Home / Self Care | Payer: Medicare Other | Source: Ambulatory Visit | Attending: Urology | Admitting: Urology

## 2022-10-28 ENCOUNTER — Ambulatory Visit: Payer: Medicare Other | Admitting: Urology

## 2022-10-28 VITALS — BP 181/95 | HR 71 | Ht 70.0 in | Wt 178.0 lb

## 2022-10-28 DIAGNOSIS — N200 Calculus of kidney: Secondary | ICD-10-CM

## 2022-10-28 DIAGNOSIS — R972 Elevated prostate specific antigen [PSA]: Secondary | ICD-10-CM | POA: Diagnosis not present

## 2022-10-28 DIAGNOSIS — N401 Enlarged prostate with lower urinary tract symptoms: Secondary | ICD-10-CM

## 2022-10-28 DIAGNOSIS — Z87898 Personal history of other specified conditions: Secondary | ICD-10-CM

## 2022-10-28 NOTE — Progress Notes (Signed)
I, Maysun Anabel Bene, acting as a scribe for Riki Altes, MD., have documented all relevant documentation on the behalf of Riki Altes, MD, as directed by Riki Altes, MD while in the presence of Riki Altes, MD.  10/28/2022 10:25 AM   Gerlene Burdock Leona Singleton 06-02-1950 [id number]  Referring provider: Kandyce Rud, MD [phone] [address] [facility] [place],  [place] [address]  Chief Complaint  Patient presents with   Nephrolithiasis   Urologic history: 1.  BPH with lower urinary tract symptoms Previously on tamsulosin, discontinued 2021    2.  History elevated PSA 12.2 03/2017; repeat PSAs have remained at baseline   3.  Nephrolithiasis Ureteroscopic removal 13 mm right UPJ stone 03/2018 Nonobstructing left renal calculi Ureteroscopic removal 8 mm distal calculus 11/2018   4.  Erectile dysfunction On vardenafil  HPI: MARICO GENERAL is a 72 y.o. male presents for annual follow-up.   No bothersome LUTS; IPSS today 8/35 Denies flank, abdominal, pelvic pain No dysuria or gross hematuria PSA 06/2022 normal 3.40  PSA trend  Prostate Specific Ag, Serum  Latest Ref Rng 0.0 - 4.0 ng/mL  10/10/2018 3.4   10/16/2019 4.1 (H)   10/21/2020 4.6 (H)   05/06/2021 4.4 (H)   10/22/2021 3.6       PMH: Past Medical History:  Diagnosis Date   Acid reflux    Arthritis    Bell's palsy    COPD (chronic obstructive pulmonary disease) (HCC)    Gout    Heart murmur    History of kidney stones    Hyperlipidemia    Hypertension    PAF (paroxysmal atrial fibrillation) (HCC)    Sleep apnea    Vertigo    3-4 episodes.  last one over 1 yr ago   Wears dentures    Partial upper, full lower    Surgical History: Past Surgical History:  Procedure Laterality Date   CATARACT EXTRACTION W/PHACO Left 08/13/2019   Procedure: CATARACT EXTRACTION PHACO AND INTRAOCULAR LENS PLACEMENT (IOC) LEFT;  Surgeon: Galen Manila, MD;  Location: [facility];  Service: Ophthalmology;  Laterality: Left;  sleep apnea   COLONOSCOPY WITH PROPOFOL     CYSTOSCOPY W/ RETROGRADES Right 03/23/2018   Procedure: CYSTOSCOPY WITH RETROGRADE PYELOGRAM;  Surgeon: Sondra Come, MD;  Location: [facility];  Service: Urology;  Laterality: Right;   CYSTOSCOPY W/ RETROGRADES Right 11/23/2018   Procedure: CYSTOSCOPY WITH RETROGRADE PYELOGRAM;  Surgeon: Sondra Come, MD;  Location: [facility];  Service: Urology;  Laterality: Right;   CYSTOSCOPY WITH STENT PLACEMENT Right 03/23/2018   Procedure: CYSTOSCOPY WITH STENT PLACEMENT;  Surgeon: Sondra Come, MD;  Location: [facility];  Service: Urology;  Laterality: Right;   CYSTOSCOPY WITH URETHRAL DILATATION N/A 11/23/2018   Procedure: CYSTOSCOPY WITH URETHRAL DILATATION;  Surgeon: Sondra Come, MD;  Location: [facility];  Service: Urology;  Laterality: N/A;   CYSTOSCOPY/URETEROSCOPY/HOLMIUM LASER/STENT PLACEMENT Right 04/06/2018   Procedure: CYSTOSCOPY/URETEROSCOPY/HOLMIUM LASER/STENT Exchange;  Surgeon: Sondra Come, MD;  Location: [facility];  Service: Urology;  Laterality: Right;   CYSTOSCOPY/URETEROSCOPY/HOLMIUM LASER/STENT PLACEMENT Right 11/23/2018   Procedure: CYSTOSCOPY/URETEROSCOPY/HOLMIUM LASER/STENT PLACEMENT;  Surgeon: Sondra Come, MD;  Location: [facility];  Service: Urology;  Laterality: Right;   KIDNEY STONE SURGERY     LASIK Bilateral    SHOULDER ARTHROSCOPY Right     Home Medications:  Allergies as of 10/28/2022       Reactions  Ciprofloxacin    Causes severe dizziness.        Medication List        Accurate as of October 28, 2022 10:25 AM. If you have any questions, ask your nurse or doctor.          albuterol 108 (90 Base) MCG/ACT inhaler Commonly known as: VENTOLIN HFA Inhale 2 puffs into the lungs every 6 (six) hours as needed for wheezing or shortness of breath.   allopurinol 300 MG tablet Commonly known as: ZYLOPRIM Take 300 mg by mouth daily.   aspirin EC 81  MG tablet Take 81 mg by mouth daily.   atorvastatin 40 MG tablet Commonly known as: LIPITOR Take 40 mg by mouth daily.   benzonatate 100 MG capsule Commonly known as: TESSALON Take 2 capsules (200 mg total) by mouth every 8 (eight) hours.   chlorthalidone 25 MG tablet Commonly known as: HYGROTON Take 25 mg by mouth every morning.   docusate sodium 100 MG capsule Commonly known as: COLACE Take 100 mg by mouth daily.   Eliquis 5 MG Tabs tablet Generic drug: apixaban Take 5 mg by mouth 2 (two) times daily.   enalapril 10 MG tablet Commonly known as: VASOTEC Take 10 mg by mouth daily.   famotidine 20 MG tablet Commonly known as: PEPCID Take 20 mg by mouth 2 (two) times daily.   Fluticasone-Salmeterol 250-50 MCG/DOSE Aepb Commonly known as: Advair Diskus Inhale 1 puff into the lungs 2 (two) times daily.   ipratropium 0.06 % nasal spray Commonly known as: ATROVENT Place 2 sprays into both nostrils 4 (four) times daily.   promethazine-dextromethorphan 6.25-15 MG/5ML syrup Commonly known as: PROMETHAZINE-DM Take 5 mLs by mouth 4 (four) times daily as needed.   vardenafil 20 MG tablet Commonly known as: LEVITRA 1 tab 60 minutes prior to intercourse   verapamil 360 MG 24 hr capsule Commonly known as: VERELAN Take 360 mg by mouth at bedtime.        Allergies:  Allergies  Allergen Reactions   Ciprofloxacin     Causes severe dizziness.    Family History: Family History  Problem Relation Age of Onset   Kidney cancer Brother    Kidney disease Neg Hx    Prostate cancer Neg Hx     Social History:  reports that he has been smoking cigarettes. He has a 50 pack-year smoking history. He has never used smokeless tobacco. He reports that he does not currently use alcohol. He reports that he does not use drugs.   Physical Exam: BP (!) 181/95   Pulse 71   Ht 5\' 10"  (1.778 m)   Wt 178 lb (80.7 kg)   BMI 25.54 kg/m   Constitutional:  Alert and oriented, No acute  distress. HEENT:  AT Respiratory: Normal respiratory effort, no increased work of breathing. Psychiatric: Normal mood and affect.   Pertinent Imaging: KUB performed early today, was personally reviewed and interpreted. There are bilateral calcifications overlying the renal outlines and no definite calcifications seen along the expected course of the ureter.    Assessment & Plan:    1. Nephrolithiasis Stable bilateral renal calculi.  Follow up 1 year with KUB.   2. BPH with LUTS Stable  3. History elevated PSA Most recent PSA in the normal range.  I have reviewed the above documentation for accuracy and completeness, and I agree with the above.   Riki Altes, MD  [facility] [address], [address] [place], [place]  [address] [phone]  

## 2023-11-01 ENCOUNTER — Ambulatory Visit: Payer: Self-pay | Admitting: Urology

## 2023-11-01 ENCOUNTER — Encounter: Payer: Self-pay | Admitting: Urology

## 2023-11-01 ENCOUNTER — Other Ambulatory Visit: Payer: Self-pay

## 2023-11-01 ENCOUNTER — Ambulatory Visit
Admission: RE | Admit: 2023-11-01 | Discharge: 2023-11-01 | Disposition: A | Discharge status: Home / Self Care | Source: Ambulatory Visit | Attending: Urology | Admitting: Urology

## 2023-11-01 VITALS — BP 157/79 | HR 78 | Ht 69.0 in | Wt 182.0 lb

## 2023-11-01 DIAGNOSIS — N200 Calculus of kidney: Secondary | ICD-10-CM

## 2023-11-01 DIAGNOSIS — R972 Elevated prostate specific antigen [PSA]: Secondary | ICD-10-CM | POA: Diagnosis not present

## 2023-11-01 DIAGNOSIS — N401 Enlarged prostate with lower urinary tract symptoms: Secondary | ICD-10-CM

## 2023-11-01 NOTE — Progress Notes (Signed)
 11/01/2023 9:50 AM   Christian Cline 1950/09/27 [id number]  Referring provider: Diedra Lame, MD [phone] [facility] - [facility] Internal Medicine [place],  [place] [address]  Chief Complaint  Patient presents with   Follow-up    Urologic history: 1.  BPH with lower urinary tract symptoms Previously on tamsulosin , discontinued 2021    2.  History elevated PSA 12.2 03/2017; repeat PSAs have remained at baseline   3.  Nephrolithiasis Ureteroscopic removal 13 mm right UPJ stone 03/2018 Nonobstructing left renal calculi Ureteroscopic removal 8 mm distal calculus 11/2018   4.  Erectile dysfunction On vardenafil    HPI: Christian Cline is a 73 y.o. male presents for annual follow-up  No bothersome urinary symptoms since last year's visit Denies dysuria, gross hematuria Thinks he may have passed a small kidney stone last week Currently not taking a PDE 5 inhibitor PSA 06/27/2023 checked by PCP was elevated above baseline at 5.2   PSA trend   Prostate Specific Ag, Serum  Latest Ref Rng 0.0 - 4.0 ng/mL  10/10/2018 3.4   10/16/2019 4.1 (H)   10/21/2020 4.6 (H)   05/06/2021 4.4 (H)   10/22/2021 3.6   06/27/2022 3.4  06/27/2023 5.2    PMH: Past Medical History:  Diagnosis Date   Acid reflux    Arthritis    Bell's palsy    COPD (chronic obstructive pulmonary disease) (HCC)    Gout    Heart murmur    History of kidney stones    Hyperlipidemia    Hypertension    PAF (paroxysmal atrial fibrillation) (HCC)    Sleep apnea    Vertigo    3-4 episodes.  last one over 1 yr ago   Wears dentures    Partial upper, full lower    Surgical History: Past Surgical History:  Procedure Laterality Date   CATARACT EXTRACTION W/PHACO Left 08/13/2019   Procedure: CATARACT EXTRACTION PHACO AND INTRAOCULAR LENS PLACEMENT (IOC) LEFT;  Surgeon: Jaye Fallow, MD;  Location: [facility];  Service: Ophthalmology;  Laterality: Left;  sleep apnea   COLONOSCOPY  WITH PROPOFOL      CYSTOSCOPY W/ RETROGRADES Right 03/23/2018   Procedure: CYSTOSCOPY WITH RETROGRADE PYELOGRAM;  Surgeon: Francisca Redell BROCKS, MD;  Location: [facility];  Service: Urology;  Laterality: Right;   CYSTOSCOPY W/ RETROGRADES Right 11/23/2018   Procedure: CYSTOSCOPY WITH RETROGRADE PYELOGRAM;  Surgeon: Francisca Redell BROCKS, MD;  Location: [facility];  Service: Urology;  Laterality: Right;   CYSTOSCOPY WITH STENT PLACEMENT Right 03/23/2018   Procedure: CYSTOSCOPY WITH STENT PLACEMENT;  Surgeon: Francisca Redell BROCKS, MD;  Location: [facility];  Service: Urology;  Laterality: Right;   CYSTOSCOPY WITH URETHRAL DILATATION N/A 11/23/2018   Procedure: CYSTOSCOPY WITH URETHRAL DILATATION;  Surgeon: Francisca Redell BROCKS, MD;  Location: [facility];  Service: Urology;  Laterality: N/A;   CYSTOSCOPY/URETEROSCOPY/HOLMIUM LASER/STENT PLACEMENT Right 04/06/2018   Procedure: CYSTOSCOPY/URETEROSCOPY/HOLMIUM LASER/STENT Exchange;  Surgeon: Francisca Redell BROCKS, MD;  Location: [facility];  Service: Urology;  Laterality: Right;   CYSTOSCOPY/URETEROSCOPY/HOLMIUM LASER/STENT PLACEMENT Right 11/23/2018   Procedure: CYSTOSCOPY/URETEROSCOPY/HOLMIUM LASER/STENT PLACEMENT;  Surgeon: Francisca Redell BROCKS, MD;  Location: [facility];  Service: Urology;  Laterality: Right;   KIDNEY STONE SURGERY     LASIK Bilateral    SHOULDER ARTHROSCOPY Right     Home Medications:  Allergies as of 11/01/2023       Reactions   Ciprofloxacin     Causes severe dizziness.        Medication List  Accurate as of November 01, 2023  9:50 AM. If you have any questions, ask your nurse or doctor.          albuterol  108 (90 Base) MCG/ACT inhaler Commonly known as: VENTOLIN  HFA Inhale 2 puffs into the lungs every 6 (six) hours as needed for wheezing or shortness of breath.   allopurinol  300 MG tablet Commonly known as: ZYLOPRIM  Take 300 mg by mouth daily.   aspirin  EC 81 MG tablet Take 81 mg by mouth daily.   atorvastatin  40 MG tablet Commonly known  as: LIPITOR Take 40 mg by mouth daily.   benzonatate  100 MG capsule Commonly known as: TESSALON  Take 2 capsules (200 mg total) by mouth every 8 (eight) hours.   chlorthalidone  25 MG tablet Commonly known as: HYGROTON  Take 25 mg by mouth every morning.   docusate sodium  100 MG capsule Commonly known as: COLACE Take 100 mg by mouth daily.   Eliquis  5 MG Tabs tablet Generic drug: apixaban  Take 5 mg by mouth 2 (two) times daily.   enalapril 10 MG tablet Commonly known as: VASOTEC Take 10 mg by mouth daily.   famotidine  20 MG tablet Commonly known as: PEPCID  Take 20 mg by mouth 2 (two) times daily.   Fluticasone -Salmeterol 250-50 MCG/DOSE Aepb Commonly known as: Advair Diskus Inhale 1 puff into the lungs 2 (two) times daily.   ipratropium 0.06 % nasal spray Commonly known as: ATROVENT  Place 2 sprays into both nostrils 4 (four) times daily.   promethazine -dextromethorphan 6.25-15 MG/5ML syrup Commonly known as: PROMETHAZINE -DM Take 5 mLs by mouth 4 (four) times daily as needed.   vardenafil  20 MG tablet Commonly known as: LEVITRA  1 tab 60 minutes prior to intercourse   verapamil  360 MG 24 hr capsule Commonly known as: VERELAN  Take 360 mg by mouth at bedtime.        Allergies:  Allergies  Allergen Reactions   Ciprofloxacin      Causes severe dizziness.    Family History: Family History  Problem Relation Age of Onset   Kidney cancer Brother    Kidney disease Neg Hx    Prostate cancer Neg Hx     Social History:  reports that he has been smoking cigarettes. He has a 50 pack-year smoking history. He has never used smokeless tobacco. He reports that he does not currently use alcohol. He reports that he does not use drugs.   Physical Exam: BP (!) 157/79   Pulse 78   Ht 5' 9 (1.753 m)   Wt 182 lb (82.6 kg)   BMI 26.88 kg/m   Constitutional:  Alert, No acute distress. HEENT: [place] AT Respiratory: Normal respiratory effort, no increased work of  breathing. Psychiatric: Normal mood and affect.   Pertinent Imaging: KUB performed earlier this morning was personally reviewed and interpreted.  Stable left sided calcifications.  Large amount of stool and bowel gas obscuring the right renal outline and no definite right renal calcifications are seen today   Assessment & Plan:    1.  Nephrolithiasis Stable Continue annual follow-up with KUB  2.  BPH with LUTS Currently no bothersome symptoms  3.  Elevated PSA PSA April 2025 elevated Recheck today and if persistently elevated above baseline recommend prostate MRI   Glendia JAYSON Barba, MD  [facility] [address], [address] [place], [place] [address] [phone]

## 2023-11-02 LAB — PSA: Prostate Specific Ag, Serum: 3.1 ng/mL (ref 0.0–4.0)

## 2023-11-03 ENCOUNTER — Ambulatory Visit: Payer: Self-pay | Admitting: Urology

## 2024-11-01 ENCOUNTER — Ambulatory Visit: Admitting: Urology
